# Patient Record
Sex: Male | Born: 1955 | Race: White | Hispanic: No | Marital: Married | Smoking: Never smoker
Health system: Southern US, Community
[De-identification: ages and names within clinical notes are randomized; demographics above are authoritative.]

## PROBLEM LIST (undated history)

## (undated) DIAGNOSIS — N400 Enlarged prostate without lower urinary tract symptoms: Secondary | ICD-10-CM

## (undated) DIAGNOSIS — M1990 Unspecified osteoarthritis, unspecified site: Secondary | ICD-10-CM

## (undated) DIAGNOSIS — J302 Other seasonal allergic rhinitis: Secondary | ICD-10-CM

## (undated) DIAGNOSIS — Z8619 Personal history of other infectious and parasitic diseases: Secondary | ICD-10-CM

## (undated) HISTORY — DX: Unspecified osteoarthritis, unspecified site: M19.90

## (undated) HISTORY — DX: Benign prostatic hyperplasia without lower urinary tract symptoms: N40.0

## (undated) HISTORY — DX: Other seasonal allergic rhinitis: J30.2

## (undated) HISTORY — DX: Personal history of other infectious and parasitic diseases: Z86.19

---

## 1978-12-16 HISTORY — PX: SHOULDER SURGERY: SHX246

## 2000-12-16 HISTORY — PX: MENISCUS REPAIR: SHX5179

## 2000-12-16 HISTORY — PX: KNEE SURGERY: SHX244

## 2003-12-17 DIAGNOSIS — Z8619 Personal history of other infectious and parasitic diseases: Secondary | ICD-10-CM

## 2003-12-17 HISTORY — DX: Personal history of other infectious and parasitic diseases: Z86.19

## 2004-08-06 ENCOUNTER — Encounter: Admission: RE | Admit: 2004-08-06 | Discharge: 2004-08-06 | Payer: Self-pay | Admitting: Internal Medicine

## 2004-09-03 ENCOUNTER — Encounter: Admission: RE | Admit: 2004-09-03 | Discharge: 2004-09-03 | Payer: Self-pay | Admitting: Gastroenterology

## 2004-09-05 ENCOUNTER — Encounter: Admission: RE | Admit: 2004-09-05 | Discharge: 2004-09-05 | Payer: Self-pay | Admitting: Gastroenterology

## 2009-12-16 HISTORY — PX: PROSTATECTOMY: SHX69

## 2010-11-29 ENCOUNTER — Encounter (INDEPENDENT_AMBULATORY_CARE_PROVIDER_SITE_OTHER): Payer: Self-pay | Admitting: Urology

## 2010-11-29 ENCOUNTER — Observation Stay (HOSPITAL_COMMUNITY)
Admission: RE | Admit: 2010-11-29 | Discharge: 2010-11-30 | Payer: Self-pay | Source: Home / Self Care | Attending: Urology | Admitting: Urology

## 2011-01-06 ENCOUNTER — Encounter: Payer: Self-pay | Admitting: Gastroenterology

## 2011-02-26 LAB — SURGICAL PCR SCREEN: Staphylococcus aureus: NEGATIVE

## 2012-07-30 ENCOUNTER — Ambulatory Visit
Admission: RE | Admit: 2012-07-30 | Discharge: 2012-07-30 | Disposition: A | Discharge status: Home / Self Care | Payer: Managed Care, Other (non HMO) | Source: Ambulatory Visit | Attending: Chiropractic Medicine | Admitting: Chiropractic Medicine

## 2012-07-30 ENCOUNTER — Other Ambulatory Visit: Payer: Self-pay | Admitting: Chiropractic Medicine

## 2012-07-30 DIAGNOSIS — R52 Pain, unspecified: Secondary | ICD-10-CM

## 2013-08-13 ENCOUNTER — Other Ambulatory Visit: Payer: Self-pay | Admitting: Interventional Cardiology

## 2013-08-13 ENCOUNTER — Encounter (HOSPITAL_COMMUNITY): Payer: Self-pay | Admitting: Pharmacy Technician

## 2013-08-19 ENCOUNTER — Encounter (HOSPITAL_COMMUNITY)
Admission: RE | Disposition: A | Discharge status: Home / Self Care | Payer: Self-pay | Source: Ambulatory Visit | Attending: Interventional Cardiology

## 2013-08-19 ENCOUNTER — Ambulatory Visit (HOSPITAL_COMMUNITY)
Admission: RE | Admit: 2013-08-19 | Discharge: 2013-08-19 | Disposition: A | Discharge status: Home / Self Care | Payer: BC Managed Care – PPO | Source: Ambulatory Visit | Attending: Interventional Cardiology | Admitting: Interventional Cardiology

## 2013-08-19 DIAGNOSIS — R9439 Abnormal result of other cardiovascular function study: Secondary | ICD-10-CM | POA: Diagnosis present

## 2013-08-19 DIAGNOSIS — I208 Other forms of angina pectoris: Secondary | ICD-10-CM | POA: Diagnosis present

## 2013-08-19 DIAGNOSIS — I2510 Atherosclerotic heart disease of native coronary artery without angina pectoris: Secondary | ICD-10-CM | POA: Insufficient documentation

## 2013-08-19 HISTORY — PX: LEFT HEART CATHETERIZATION WITH CORONARY ANGIOGRAM: SHX5451

## 2013-08-19 SURGERY — LEFT HEART CATHETERIZATION WITH CORONARY ANGIOGRAM
Anesthesia: LOCAL

## 2013-08-19 MED ORDER — ACETAMINOPHEN 325 MG PO TABS: 650.0000 mg | ORAL_TABLET | ORAL | Status: DC | PRN

## 2013-08-19 MED ORDER — NITROGLYCERIN 0.2 MG/ML ON CALL CATH LAB
INTRAVENOUS | Status: AC
  Filled 2013-08-19: qty 1

## 2013-08-19 MED ORDER — VERAPAMIL HCL 2.5 MG/ML IV SOLN
INTRAVENOUS | Status: AC
  Filled 2013-08-19: qty 2

## 2013-08-19 MED ORDER — ASPIRIN 81 MG PO CHEW: 81.0000 mg | CHEWABLE_TABLET | Freq: Every day | ORAL | Status: DC

## 2013-08-19 MED ORDER — SODIUM CHLORIDE 0.9 % IJ SOLN: 3.0000 mL | Freq: Two times a day (BID) | INTRAMUSCULAR | Status: DC

## 2013-08-19 MED ORDER — ONDANSETRON HCL 4 MG/2ML IJ SOLN: 4.0000 mg | Freq: Four times a day (QID) | INTRAMUSCULAR | Status: DC | PRN

## 2013-08-19 MED ORDER — DIAZEPAM 5 MG PO TABS
5.0000 mg | ORAL_TABLET | ORAL | Status: AC
  Administered 2013-08-19: 5 mg via ORAL
  Filled 2013-08-19: qty 1

## 2013-08-19 MED ORDER — ASPIRIN 81 MG PO CHEW
324.0000 mg | CHEWABLE_TABLET | ORAL | Status: AC
  Administered 2013-08-19: 324 mg via ORAL
  Filled 2013-08-19: qty 4

## 2013-08-19 MED ORDER — LIDOCAINE HCL (PF) 1 % IJ SOLN
INTRAMUSCULAR | Status: AC
  Filled 2013-08-19: qty 30

## 2013-08-19 MED ORDER — HEPARIN (PORCINE) IN NACL 2-0.9 UNIT/ML-% IJ SOLN
INTRAMUSCULAR | Status: AC
  Filled 2013-08-19: qty 1000

## 2013-08-19 MED ORDER — MIDAZOLAM HCL 2 MG/2ML IJ SOLN
INTRAMUSCULAR | Status: AC
  Filled 2013-08-19: qty 2

## 2013-08-19 MED ORDER — SODIUM CHLORIDE 0.9 % IV SOLN: INTRAVENOUS | Status: DC

## 2013-08-19 MED ORDER — FENTANYL CITRATE 0.05 MG/ML IJ SOLN
INTRAMUSCULAR | Status: AC
  Filled 2013-08-19: qty 2

## 2013-08-19 MED ORDER — SODIUM CHLORIDE 0.9 % IV SOLN: 250.0000 mL | INTRAVENOUS | Status: DC | PRN

## 2013-08-19 MED ORDER — SODIUM CHLORIDE 0.9 % IJ SOLN: 3.0000 mL | INTRAMUSCULAR | Status: DC | PRN

## 2013-08-19 MED ORDER — SODIUM CHLORIDE 0.9 % IV SOLN
INTRAVENOUS | Status: DC
  Administered 2013-08-19: 08:00:00 via INTRAVENOUS

## 2013-08-19 MED ORDER — HEPARIN SODIUM (PORCINE) 1000 UNIT/ML IJ SOLN
INTRAMUSCULAR | Status: AC
  Filled 2013-08-19: qty 1

## 2013-08-19 NOTE — H&P (Signed)
  The patient has been experiencing chest discomfort on exertion. There is been a consistent finding over the past several weeks as he is trying to get back into a program of aerobic exercise. He subsequently underwent a stress nuclear study which was abnormal, moderate risk. Given his young age and the limitations in part on his lifestyle, we are performing coronary angiography to define anatomy. He's had no discomfort at rest. He is taking aspirin one per day. He has not needed to use any nitroglycerin.  The procedure and risks including stroke, death, myocardial infarction, contrast allergy, limb ischemia, bleeding, among others were discussed in detail with the patient and his wife. He understands and has given consent to proceed with the procedure.

## 2013-08-19 NOTE — Progress Notes (Signed)
Upon removing 3 cc of air from TR band, site started bleeding. 3 cc air injected into TR band balloon, bleeding immediately stopped. Will monitor.

## 2013-08-19 NOTE — CV Procedure (Signed)
     Diagnostic Cardiac Catheterization Report  Tyler Castro  57 y.o.  male 1956/02/12  Procedure Date: 08/19/2013  Referring Physician: Elias Else M.D. Primary Cardiologist:: HWB Leia Alf, M.D.   PROCEDURE:  Left heart catheterization with selective coronary angiography, left ventriculogram.  INDICATIONS:  Exertional chest tightness, abnormal nuclear study, with concern for angina pectoris.  The risks, benefits, and details of the procedure were explained to the patient.  The patient verbalized understanding and wanted to proceed.  Informed written consent was obtained.  PROCEDURE TECHNIQUE:  After Xylocaine anesthesia a 5 [place] Skinny sheath was placed in the right radial artery with a single anterior needle wall stick.   Coronary angiography was done using a 5 French A2 MP and 3.5 cm Judkins left diagnostic catheter.  Left ventriculography was done using a 5 [place] A2 MP catheter.    CONTRAST:  Total of 110 cc.  COMPLICATIONS:  None.    HEMODYNAMICS:  Aortic pressure was 102/60; LV pressure was 106/5; LVEDP 14 mm mercury.  There was no gradient between the left ventricle and aorta.    ANGIOGRAPHIC DATA:  Cinefluoroscopy of the cardiac silhouette demonstrates left coronary calcification  The left main coronary artery is widely patent.  The left anterior descending artery is widely patent LAD and diagonals. The proximal LAD has linear calcification just after the origin from the left main. Both diagonals have minimal ostial narrowing..  The left circumflex artery is widely patent giving origin to 3 obtuse marginal branches and a large left atrial recurrent arising just inferior to the second obtuse marginal..  The right coronary artery is distal eccentric 25-35% stenosis. Large vessel without any significant obstruction.  LEFT VENTRICULOGRAM:  Left ventricular angiogram was done in the 30 RAO projection and revealed normal left ventricular wall motion and systolic  function with an estimated ejection fraction of 60 %.    IMPRESSIONS:  1. Coronary calcification, LAD, but widely patent vessel lumen. The right coronary has distal eccentric less than 30% narrowing.  2. Normal left ventricular function.  3. Overall, coronary angiography demonstrates widely patent coronaries, and should not be responsible for any symptoms. For the patient's age, these findings are essentially normal. The myocardial perfusion pattern on nuclear testing likely represents a normal variant.   RECOMMENDATION:  Aspirin and primary prevention with consideration of statin therapy if indicated based upon numbers.Marland Kitchen

## 2013-09-24 ENCOUNTER — Encounter: Payer: Self-pay | Admitting: Pulmonary Disease

## 2013-09-24 ENCOUNTER — Ambulatory Visit (INDEPENDENT_AMBULATORY_CARE_PROVIDER_SITE_OTHER): Payer: BC Managed Care – PPO | Admitting: Pulmonary Disease

## 2013-09-24 VITALS — BP 132/90 | HR 60 | Temp 98.7°F | Ht 76.0 in | Wt 223.8 lb

## 2013-09-24 DIAGNOSIS — R0609 Other forms of dyspnea: Secondary | ICD-10-CM | POA: Insufficient documentation

## 2013-09-24 NOTE — Progress Notes (Signed)
  Subjective:    Patient ID: Tyler Castro, male    DOB: Sep 20, 1956, 57 y.o.   MRN: [id number]  HPI The patient is a 57 year old male who had been asked to see for dyspnea on exertion.  He has a fairly active person, and was doing well until March of this year when he began to develop dyspnea on running.  This was new for him at that particular level of exertion.  He feels that his symptoms have been progressive since that time, and in June and July of this year began to develop chest tightness.  He underwent stress testing which was abnormal, which subsequently led to a cardiac catheterization that was also unremarkable.  He had a chest x-ray that was clear, but showed "emphysematous changes".  The patient denies any history of childhood asthma, but his mother did have asthma diagnosed in her 37s.  He does have seasonal allergies.  He has no significant cough except occasionally at night on lying down.  He denies feeling a sensation of air trapping.   Review of Systems  Constitutional: Negative for fever and unexpected weight change.  HENT: Positive for congestion. Negative for dental problem, ear pain, nosebleeds, postnasal drip, rhinorrhea, sinus pressure, sneezing, sore throat and trouble swallowing.   Eyes: Negative for redness and itching.  Respiratory: Positive for chest tightness, shortness of breath and wheezing. Negative for cough.   Cardiovascular: Positive for palpitations. Negative for chest pain and leg swelling.  Gastrointestinal: Negative for nausea and vomiting.  Genitourinary: Negative for dysuria.  Musculoskeletal: Negative for joint swelling.  Skin: Negative for rash.  Neurological: Positive for headaches ( sinus related).  Hematological: Does not bruise/bleed easily.  Psychiatric/Behavioral: Negative for dysphoric mood. The patient is not nervous/anxious.        Objective:   Physical Exam Constitutional:  Well developed, no acute distress  HENT:  Nares patent without  discharge  Oropharynx without exudate, palate and uvula are normal  Eyes:  Perrla, eomi, no scleral icterus  Neck:  No JVD, no TMG  Cardiovascular:  Normal rate, regular rhythm, no rubs or gallops.  No murmurs        Intact distal pulses  Pulmonary :  Normal breath sounds, no stridor or respiratory distress   No rales, rhonchi, or wheezing  Abdominal:  Soft, nondistended, bowel sounds present.  No tenderness noted.   Musculoskeletal:  No lower extremity edema noted.  Lymph Nodes:  No cervical lymphadenopathy noted  Skin:  No cyanosis noted  Neurologic:  Alert, appropriate, moves all 4 extremities without obvious deficit.         Assessment & Plan:

## 2013-09-24 NOTE — Assessment & Plan Note (Signed)
The patient has dyspnea on exertion starting in March of this year that has been progressive.  He has had a cardiac catheterization which was unremarkable, and a chest x-ray that was clear but raised the question of emphysematous changes.  The patient's dyspnea and exertion primarily related to severe exertional activities.  At the same time however, he recently was in [place] climbing and did not have difficulties even at altitude.  The patient has never smoked, but does have a family history of asthma later in life.  At this point, he will need full pulmonary function studies for evaluation, and if normal, I would recommend an echocardiogram and possibly a cardiopulmonary exercise test.

## 2013-09-24 NOTE — Patient Instructions (Signed)
Will schedule for pulmonary function tests.  Will call you once results are available.

## 2013-09-28 ENCOUNTER — Ambulatory Visit (INDEPENDENT_AMBULATORY_CARE_PROVIDER_SITE_OTHER): Payer: BC Managed Care – PPO | Admitting: Pulmonary Disease

## 2013-09-28 DIAGNOSIS — R0609 Other forms of dyspnea: Secondary | ICD-10-CM

## 2013-09-28 LAB — PULMONARY FUNCTION TEST

## 2013-09-28 NOTE — Progress Notes (Signed)
PFT done today. 

## 2013-10-01 ENCOUNTER — Telehealth: Payer: Self-pay | Admitting: Pulmonary Disease

## 2013-10-01 NOTE — Telephone Encounter (Signed)
Let pt know that his pfts are normal.  No airflow obstruction to suggest copd.  I think he should have a cardiopulmonary exercise test as we discussed to see if occult lung or cardiac issues if he wishes to pursue this aggressively.

## 2013-10-01 NOTE — Telephone Encounter (Signed)
Pt had PFT on 09/28/13. I made pt aware we will send the message over to [facility] advising him of this. Please advise KC thanks

## 2013-10-04 ENCOUNTER — Telehealth: Payer: Self-pay | Admitting: Pulmonary Disease

## 2013-10-04 NOTE — Telephone Encounter (Signed)
lmomtcb for pt on his named VM.

## 2013-10-04 NOTE — Telephone Encounter (Signed)
I spoke with pt. He is wanting to know if the test suggests asthma? What was the results of the before/after of the albuterol? He wants to know exactly what his breathing tests mean? He wants to know if his PFT was "high normal" or in the "low normal" range? Wants to know what exactly Dr. Shelle Iron is looking to learn from the Cardio pulmonary exercise test? He wants specifics on all. Please advise KC thanks

## 2013-10-04 NOTE — Telephone Encounter (Signed)
I made pt aware. He stated he will call us back and let us know. Will sign off message

## 2013-10-04 NOTE — Telephone Encounter (Signed)
Pt returned call and can be reached @ [phone]. Tyler Castro

## 2013-10-05 ENCOUNTER — Other Ambulatory Visit: Payer: Self-pay | Admitting: Pulmonary Disease

## 2013-10-05 NOTE — Telephone Encounter (Signed)
Discussed test results at great length with pt. Have recommended CPST, and pt agrees.

## 2013-10-06 NOTE — Telephone Encounter (Signed)
Order was placed for CPST by Dr Shelle Iron.  Will close message

## 2013-10-21 ENCOUNTER — Ambulatory Visit (HOSPITAL_COMMUNITY): Payer: BC Managed Care – PPO | Attending: Pulmonary Disease

## 2013-10-21 ENCOUNTER — Encounter: Payer: Self-pay | Admitting: Pulmonary Disease

## 2013-10-21 DIAGNOSIS — R0609 Other forms of dyspnea: Secondary | ICD-10-CM

## 2013-10-21 DIAGNOSIS — R0989 Other specified symptoms and signs involving the circulatory and respiratory systems: Secondary | ICD-10-CM | POA: Insufficient documentation

## 2013-10-28 ENCOUNTER — Telehealth: Payer: Self-pay | Admitting: Pulmonary Disease

## 2013-10-28 NOTE — Telephone Encounter (Signed)
I spoke with pt. He had CPST on 10/21/13. Pt is requesting these results. Please advise KC thanks

## 2013-10-29 NOTE — Telephone Encounter (Signed)
Please let him know that his study has not been read by my partner who reads these studies for [place].  I will send MR a message for him to do so, then will call pt back.

## 2013-10-29 NOTE — Telephone Encounter (Signed)
Called, spoke with pt.  Explained below to him.  He verbalized understanding and was ok with this.   Will route msg to MR as a reminder and to advise once study has been read. Thank you.

## 2013-11-01 NOTE — Telephone Encounter (Signed)
Let KC know that I have read thie results and in his inbox; forwarding to [facility]  Dr. Kalman Shan, M.D., [facility].C.P Pulmonary and Critical Care Medicine Staff Physician [facility] Pager: [phone], If no answer or between  15:00h - 7:00h: call [phone]  11/01/2013 1:34 PM

## 2013-11-02 NOTE — Telephone Encounter (Signed)
Long discussion with pt about CPST, does normal really mean normal in fit individual??  There is no evidence for EIA, and VO2 /anaerobic threshold were normal.   He did have some pvc's at times during the study, but nothing consistent.I have offered to do CT chest to evaluate lung parenchyma and r/o occult PE, but have told him it has low yield.  There is really no other pulmonary evaluation to do at this point.  He remained concerned about PVC's, and told him I would send Dr. Katrinka Blazing a note.

## 2013-11-03 ENCOUNTER — Telehealth: Payer: Self-pay | Admitting: Interventional Cardiology

## 2013-11-03 DIAGNOSIS — R0609 Other forms of dyspnea: Secondary | ICD-10-CM

## 2013-11-03 NOTE — Telephone Encounter (Signed)
returned pt call. pt sts that he has had a previous cardiac work up with Dr.Smith ti determine what was causing his sob.his cardiac testing did not show the cause and he was ref to pulmonology. pt has had a cardio pulmonary exercise test and had rqst the results be fwd to Dr.Smith.pt would like an appt with Dr.Smith once those results are rvcd.adv pt that would be fine and I will call him to sch once test results rcvd.pt verbalized understanding

## 2013-11-03 NOTE — Telephone Encounter (Signed)
New message     Pt want to talk to Dr Michaelle Copas nurse.  He would not give me details---he said it was long and complicated.

## 2013-11-09 ENCOUNTER — Telehealth: Payer: Self-pay | Admitting: Interventional Cardiology

## 2013-11-09 NOTE — Telephone Encounter (Signed)
Dr.Smith would like pt to have an echo.lmom scheduling  will call pt to sch and pt is to  have a f/u appt with Dr.Smith after echo

## 2013-11-09 NOTE — Telephone Encounter (Signed)
New message ° ° ° °Returned Lisa's call °

## 2013-11-25 ENCOUNTER — Encounter: Payer: Self-pay | Admitting: Cardiovascular Disease

## 2013-11-25 ENCOUNTER — Ambulatory Visit (HOSPITAL_COMMUNITY): Payer: BC Managed Care – PPO | Attending: Cardiovascular Disease | Admitting: Radiology

## 2013-11-25 DIAGNOSIS — R0609 Other forms of dyspnea: Secondary | ICD-10-CM

## 2013-11-25 DIAGNOSIS — R0602 Shortness of breath: Secondary | ICD-10-CM | POA: Insufficient documentation

## 2013-11-25 NOTE — Progress Notes (Signed)
Echocardiogram performed.  

## 2013-11-30 ENCOUNTER — Telehealth: Payer: Self-pay

## 2013-11-30 ENCOUNTER — Ambulatory Visit (INDEPENDENT_AMBULATORY_CARE_PROVIDER_SITE_OTHER): Payer: BC Managed Care – PPO | Admitting: Interventional Cardiology

## 2013-11-30 ENCOUNTER — Encounter: Payer: Self-pay | Admitting: Interventional Cardiology

## 2013-11-30 VITALS — BP 130/74 | HR 48 | Ht 76.0 in | Wt 219.0 lb

## 2013-11-30 DIAGNOSIS — R002 Palpitations: Secondary | ICD-10-CM

## 2013-11-30 DIAGNOSIS — I493 Ventricular premature depolarization: Secondary | ICD-10-CM

## 2013-11-30 DIAGNOSIS — E785 Hyperlipidemia, unspecified: Secondary | ICD-10-CM

## 2013-11-30 DIAGNOSIS — R0609 Other forms of dyspnea: Secondary | ICD-10-CM

## 2013-11-30 DIAGNOSIS — R079 Chest pain, unspecified: Secondary | ICD-10-CM

## 2013-11-30 DIAGNOSIS — I4949 Other premature depolarization: Secondary | ICD-10-CM

## 2013-11-30 NOTE — Patient Instructions (Signed)
Your physician recommends that you continue on your current medications as directed. Please refer to the Current Medication list given to you today.  Your physician recommends that you return for a FASTING NMR lipid profile  Your physician has recommended that you wear an event monitor. Event monitors are medical devices that record the heart's electrical activity. Doctors most often [place] these monitors to diagnose arrhythmias. Arrhythmias are problems with the speed or rhythm of the heartbeat. The monitor is a small, portable device. You can wear one while you do your normal daily activities. This is usually used to diagnose what is causing palpitations/syncope (passing out). ( 2 WEEK CONTINUOUS MONITOR)  Follow up pending lab/monitor results

## 2013-11-30 NOTE — Progress Notes (Signed)
Patient ID: Tyler Castro, male   DOB: 1956/04/30, 57 y.o.   MRN: [id number]    [address] [place], [place]  [address] Phone: [phone] Fax:  [phone]  Date:  11/30/2013   ID:  Tyler Castro, DOB 13-Feb-1956, MRN [id number]  PCP:  Tyler Carmel, MD   ASSESSMENT:  1.  Exertional dyspnea and chest tightness 2.  Palpitations with previous documentation of PVCs during stress testing 3. Stress/anxiety 4. Coronary calcification PLAN:  1. Lipid NMR 2. Two-week continuous ambulatory monitor to be worn even during exercise 3. Further discussion after data obtained   SUBJECTIVE: Tyler Castro is a 57 y.o. male who continues to be bothered by limitations in exertional tolerance, predominantly dyspnea and mild chest tightness, with running. He is also now having more frequent PVCs/palpitations occurring at rest and with activity. We did no PVCs during both the exercise treadmill test that we performed and also doing his cardiopulmonary stress test. I findings so far is at the cardiopulmonary stress test was minimally abnormal for a highly trained person but performance was better than that for an age matched Tyler Castro for it. Coronary angiography was performed after an abnormal stress Cardiolite study demonstrated an inferior wall defect with peripheral redistribution. The coronary angiogram demonstrated left anterior descending calcification but no obstructive coronary atherosclerosis. Echocardiography demonstrates no surprising findings of hypertrophy or pulmonary hypertension. No evidence of pericardial disease.  The patient mentions significant stress both at work and home and wonders if this could be at play.   Wt Readings from Last 3 Encounters:  11/30/13 219 lb (99.338 kg)  09/24/13 223 lb 12.8 oz (101.515 kg)  08/19/13 210 lb (95.255 kg)     Past Medical History  Diagnosis Date  . History of hepatitis 2005  . BPH (benign prostatic hyperplasia)   . Arthritis     shoulder  . Seasonal allergies     Current Outpatient Prescriptions  Medication Sig Dispense Refill  . aspirin EC 81 MG tablet Take 81 mg by mouth daily.      . cetirizine (ZYRTEC) 10 MG tablet Take 10 mg by mouth daily as needed for allergies.      Marland Kitchen ibuprofen (ADVIL,MOTRIN) 200 MG tablet Take 400-800 mg by mouth every 6 (six) hours as needed for pain.       No current facility-administered medications for this visit.    Allergies:    Allergies  Allergen Reactions  . Sulfa Antibiotics     Liver function abnormal    Social History:  The patient  reports that he has never smoked. He does not have any smokeless tobacco history on file. He reports that he drinks alcohol. He reports that he does not use illicit drugs.   ROS:  Please see the history of present illness.   No prolonged palpitations or syncope.   All other systems reviewed and negative.   OBJECTIVE: VS:  BP 130/74  Pulse 48  Ht 6\' 4"  (1.93 m)  Wt 219 lb (99.338 kg)  BMI 26.67 kg/m2 Well nourished, well developed, in no acute distress, healthy but sad appearing HEENT: normal Neck: JVD flat. Carotid bruit absent  Cardiac:  normal S1, S2; RRR; no murmur Lungs:  clear to auscultation bilaterally, no wheezing, rhonchi or rales Abd: soft, nontender, no hepatomegaly Ext: Edema absent. Pulses 2+ Skin: warm and dry Neuro:  CNs 2-12 intact, no focal abnormalities noted  EKG:  Marked sinus bradycardia at 48 beats per minute with inferior T wave  inversion unchanged from prior tracings       Signed, Darci Needle III, MD 11/30/2013 1:34 PM

## 2013-11-30 NOTE — Telephone Encounter (Signed)
Message copied by Jarvis Newcomer on Tue Nov 30, 2013  3:54 PM ------      Message from: Verdis Prime      Created: Sun Nov 28, 2013  2:40 PM       No significant findings. ------

## 2013-11-30 NOTE — Telephone Encounter (Signed)
pt given echo results.pt verbalized understanding.

## 2013-12-07 ENCOUNTER — Other Ambulatory Visit: Payer: Self-pay | Admitting: Neurology

## 2013-12-07 DIAGNOSIS — G259 Extrapyramidal and movement disorder, unspecified: Secondary | ICD-10-CM

## 2013-12-14 ENCOUNTER — Other Ambulatory Visit: Payer: Self-pay

## 2013-12-14 ENCOUNTER — Ambulatory Visit: Payer: BC Managed Care – PPO | Admitting: *Deleted

## 2013-12-14 ENCOUNTER — Encounter (INDEPENDENT_AMBULATORY_CARE_PROVIDER_SITE_OTHER): Payer: BC Managed Care – PPO

## 2013-12-14 ENCOUNTER — Encounter: Payer: Self-pay | Admitting: *Deleted

## 2013-12-14 DIAGNOSIS — I4949 Other premature depolarization: Secondary | ICD-10-CM

## 2013-12-14 DIAGNOSIS — R002 Palpitations: Secondary | ICD-10-CM

## 2013-12-14 DIAGNOSIS — I493 Ventricular premature depolarization: Secondary | ICD-10-CM

## 2013-12-14 DIAGNOSIS — E785 Hyperlipidemia, unspecified: Secondary | ICD-10-CM

## 2013-12-14 NOTE — Progress Notes (Signed)
Patient ID: Tyler Castro, male   DOB: 10/26/1956, 57 y.o.   MRN: [id number] [employer] 14 day cardiac event monitor applied to patient.

## 2013-12-15 LAB — NMR LIPOPROFILE WITH LIPIDS
Cholesterol, Total: 194 mg/dL (ref <200)
HDL-C: 57 mg/dL (ref >=40)
Large HDL-P: 9.4 umol/L (ref >=4.8)
Large VLDL-P: 1.1 nmol/L (ref <=2.7)
Triglycerides: 63 mg/dL (ref <150)
VLDL Size: 45.5 nm (ref <=46.6)

## 2013-12-18 ENCOUNTER — Ambulatory Visit
Admission: RE | Admit: 2013-12-18 | Discharge: 2013-12-18 | Disposition: A | Discharge status: Home / Self Care | Payer: BC Managed Care – PPO | Source: Ambulatory Visit | Attending: Neurology | Admitting: Neurology

## 2013-12-18 DIAGNOSIS — G259 Extrapyramidal and movement disorder, unspecified: Secondary | ICD-10-CM

## 2013-12-23 ENCOUNTER — Telehealth: Payer: Self-pay | Admitting: Interventional Cardiology

## 2013-12-23 NOTE — Telephone Encounter (Signed)
Returned call to patient he stated he wants to remove monitor early.Stated he is suppose to remove it on  12/28/13.Stated he is not able to sleep wearing monitor.Message sent to Dr.Smith's nurse.

## 2013-12-23 NOTE — Telephone Encounter (Signed)
New message   On heart monitor has some questions .

## 2013-12-28 ENCOUNTER — Telehealth: Payer: Self-pay

## 2013-12-28 NOTE — Telephone Encounter (Signed)
pt given NMR lipid results.pt given Dr.Smith recommendations to start Crestor 10mg  and recheck NMR and Alt in 6-8 wks. pt sts that he would need to think about starting Crestor and will call back when he is ready to do so.

## 2013-12-28 NOTE — Telephone Encounter (Signed)
Message copied by Jarvis NewcomerPARRIS-GODLEY, Refugio Vandevoorde S on Tue Dec 28, 2013  8:30 AM ------      Message from: Verdis PrimeSMITH, HENRY      Created: Sun Dec 26, 2013  3:23 PM       Particle number is high. Start Crestor 10 mg daily. NMR lipid and ALT in 6-8 weeks. ------

## 2013-12-29 NOTE — Telephone Encounter (Signed)
returned pt call.pt adv that we will call him once we receive monitor results back.pt sts that he does not want to  Start crestor, pt sts that he went into liver failure the last time he attempted to take a statin medication.pt sts that he  would like to discuss lifestyle and diet changes he could make to get lipids controlled.pt wants to sch an appt with Dr.Smith to discuss.

## 2014-01-05 ENCOUNTER — Telehealth: Payer: Self-pay

## 2014-01-05 NOTE — Telephone Encounter (Signed)
pt given results of cardiac monitor.NSR.occasional pvc, no vt.pailpitations and fluttering corrilate with PVC's.pt rqst f/u appt to discuss starting statin.appt made for 01/24/14 @3 :30.pt verbalized understanding.

## 2014-01-24 ENCOUNTER — Ambulatory Visit (INDEPENDENT_AMBULATORY_CARE_PROVIDER_SITE_OTHER): Payer: BC Managed Care – PPO | Admitting: Interventional Cardiology

## 2014-01-24 ENCOUNTER — Encounter: Payer: Self-pay | Admitting: Interventional Cardiology

## 2014-01-24 VITALS — BP 112/76 | HR 50 | Ht 76.0 in | Wt 215.0 lb

## 2014-01-24 DIAGNOSIS — E785 Hyperlipidemia, unspecified: Secondary | ICD-10-CM

## 2014-01-24 DIAGNOSIS — I209 Angina pectoris, unspecified: Secondary | ICD-10-CM

## 2014-01-24 DIAGNOSIS — I208 Other forms of angina pectoris: Secondary | ICD-10-CM

## 2014-01-24 DIAGNOSIS — I2510 Atherosclerotic heart disease of native coronary artery without angina pectoris: Secondary | ICD-10-CM | POA: Insufficient documentation

## 2014-01-24 DIAGNOSIS — I493 Ventricular premature depolarization: Secondary | ICD-10-CM | POA: Insufficient documentation

## 2014-01-24 DIAGNOSIS — I4949 Other premature depolarization: Secondary | ICD-10-CM

## 2014-01-24 NOTE — Patient Instructions (Signed)
Your physician recommends that you continue on your current medications as directed. Please refer to the Current Medication list given to you today.  Lab today; Tsh, T4  Your physician recommends that you return for a FASTING NMR lipid profile: June 2015

## 2014-01-24 NOTE — Progress Notes (Signed)
Patient ID: Tyler Castro, male   DOB: [birth year], 58 y.o.   MRN: [id number]    [address] [place], [place]  [id number] Phone: [phone](336) [phone] Fax:  [phone](336) [phone]  Date:  01/24/2014   ID:  Tyler OrganMartin Castro, DOB [birth year], MRN [id number]  PCP:  No PCP Per Patient   ASSESSMENT:  1. Hyperlipidemia with increased small particle number documented by lipoma L. Analysis 2. Chronic nonobstructive coronary artery disease with moderate to heavy coronary calcification noted by cinefluoroscopy, involving the LAD. 3. Exertional angina felt to be secondary to decreased coronary vasodilator reserve 4. 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 Statin therapy is recommended because of elevated lipids and presumed abnormal coronary vasomotor activity. The patient refuses statin therapy because of concern about complications/side effects 2. Continue baby aspirin per day 3. Strict heart healthy diet tending more towards vegetarian to include attempts at additional weight loss 4. Repeat lipoma at analysis in 4 months   SUBJECTIVE: Tyler Castro is a 58 y.o. male who is here to discuss his entire cardiac workup. This significant findings to this point include left coronary calcification identifiable by cinefluoroscopy. Nonobstructive coronary disease by angiography. A cardiopulmonary function test was "normal". Echocardiography was unremarkable. No evidence of pulmonary hypertension was identified. He continues to be limited by exertional chest tightness with activity such as jogging and walking briskly up an incline. He does not have discomfort at rest. Continuous monitoring has demonstrated a correlation between palpitations and isolated PVCs.   Wt Readings from Last 3 Encounters:  01/24/14 215 lb (97.523 kg)  11/30/13 219 lb (99.338 kg)  09/24/13 223 lb 12.8 oz (101.515 kg)     Past Medical History  Diagnosis Date  . History of hepatitis 2005  . BPH (benign  prostatic hyperplasia)   . Arthritis     shoulder  . Seasonal allergies     Current Outpatient Prescriptions  Medication Sig Dispense Refill  . aspirin EC 81 MG tablet Take 81 mg by mouth daily.      . cetirizine (ZYRTEC) 10 MG tablet Take 10 mg by mouth daily as needed for allergies.      Marland Kitchen. ibuprofen (ADVIL,MOTRIN) 200 MG tablet Take 400-800 mg by mouth every 6 (six) hours as needed for pain.      Marland Kitchen. MAGNESIUM PO Take by mouth. TAKE ONE PILL DAILY       No current facility-administered medications for this visit.    Allergies:    Allergies  Allergen Reactions  . Sulfa Antibiotics     Liver function abnormal    Social History:  The patient  reports that he has never smoked. He does not have any smokeless tobacco history on file. He reports that he drinks alcohol. He reports that he does not use illicit drugs.   ROS:  Please see the history of present illness.   Concern about frequent adverse reactions to nearly any medication that he uses. Had sulfa related hepatitis which serves as a Genesis for his concern about medical therapy of any sort   All other systems reviewed and negative.   OBJECTIVE: VS:  BP 112/76  Pulse 50  Ht 6\' 4"  (1.93 m)  Wt 215 lb (97.523 kg)  BMI 26.18 kg/m2  SpO2 99% Well nourished, well developed, in no acute distress, appears healthy HEENT: normal Neck: JVD flat. Carotid bruit absent  Cardiac:  normal S1, S2; RRR; no murmur Lungs:  clear to auscultation bilaterally, no wheezing, rhonchi  or rales Abd: soft, nontender, no hepatomegaly Ext: Edema absent. Pulses 2+ and symmetric Skin: warm and dry Neuro:  CNs 2-12 intact, no focal abnormalities noted  EKG:  Not repeated       Signed, Darci Needle III, MD 01/24/2014 3:53 PM

## 2014-01-25 LAB — T4, FREE: Free T4: 0.81 ng/dL (ref 0.60–1.60)

## 2014-01-25 LAB — TSH: TSH: 1.64 u[IU]/mL (ref 0.35–5.50)

## 2014-01-26 NOTE — Progress Notes (Signed)
Quick Note:  Preliminary report reviewed by triage nurse and sent to MD desk. ______ 

## 2014-02-02 ENCOUNTER — Telehealth: Payer: Self-pay

## 2014-02-02 NOTE — Telephone Encounter (Signed)
Message copied by Jarvis NewcomerPARRIS-GODLEY, LISA S on Wed Feb 02, 2014  9:57 AM ------      Message from: Verdis PrimeSMITH, HENRY      Created: Sat Jan 29, 2014 12:25 PM       Thyroid studies are normal. ------

## 2014-02-02 NOTE — Telephone Encounter (Signed)
lmom.Thyroid studies are normal.

## 2014-05-17 ENCOUNTER — Other Ambulatory Visit: Payer: BC Managed Care – PPO

## 2014-05-17 DIAGNOSIS — E785 Hyperlipidemia, unspecified: Secondary | ICD-10-CM

## 2014-05-18 ENCOUNTER — Telehealth: Payer: Self-pay | Admitting: Interventional Cardiology

## 2014-05-18 LAB — NMR LIPOPROFILE WITH LIPIDS
CHOLESTEROL, TOTAL: 181 mg/dL (ref <200)
HDL PARTICLE NUMBER: 31.7 umol/L (ref >=30.5)
HDL Size: 9.8 nm (ref >=9.2)
HDL-C: 64 mg/dL (ref >=40)
LARGE HDL: 10 umol/L (ref >=4.8)
LDL CALC: 105 mg/dL — AB (ref <100)
LDL Particle Number: 1237 nmol/L — ABNORMAL HIGH (ref <1000)
LDL Size: 21.3 nm (ref >20.5)
Large VLDL-P: <0.8 nmol/L (ref <=2.7)
SMALL LDL PARTICLE NUMBER: 346 nmol/L (ref <=527)
Triglycerides: 62 mg/dL (ref <150)
VLDL Size: 37.9 nm (ref <=46.6)

## 2014-05-18 NOTE — Telephone Encounter (Signed)
New message ° ° ° ° °Want lab results °

## 2014-05-18 NOTE — Telephone Encounter (Signed)
Pt contacting to see if Dr Katrinka Blazing has reviewed his recent lab work from 6/2 and given his final recommendation.  Pt also wanting to know if he will need to schedule an f/u OV with Dr Katrinka Blazing based on his lab results.  Informed pt that Dr Katrinka Blazing has not reviewed lab work and given his final recommendation yet, but as soon as he does, his CMA will contact him to further advise on new orders.  Pt verbalized understanding and agrees with this plan.

## 2014-05-25 ENCOUNTER — Telehealth: Payer: Self-pay

## 2014-05-25 NOTE — Telephone Encounter (Signed)
Pt aware of lab results.and Dr.Smiths recommendations.There is dramatic improvement . Near target now. Needs to maintain current approach. Could be at goal on minimal statin dose and current lifestyle changes. I suggest low dose Atorvastatin 10 mg daily and repeat statin panel in 6 weeks.pt would like to hold off on starting statin, continue with lifestyle change and repeat NMR lipid in a couple months to see if he is closer to goal.adv pt I will fwd to Dr.Smith to see when he thinks it would be appropriate to repeat lab.pt agreeable and verbalized understanding.

## 2014-05-25 NOTE — Telephone Encounter (Signed)
Message copied by Jarvis Newcomer on Wed May 25, 2014  1:01 PM ------      Message from: Verdis Prime      Created: Wed May 18, 2014  4:47 PM       There is dramatic improvement . Near target now. Needs to maintain current approach. Could be at goal on minimal statin dose and current lifestyle changes. I suggest low dose Atorvastatin 10 mg daily and repeat statin panel in 6 weeks ------

## 2014-05-25 NOTE — Telephone Encounter (Signed)
Message copied by Jarvis Newcomer on Wed May 25, 2014  1:09 PM ------      Message from: Verdis Prime      Created: Wed May 18, 2014  4:47 PM       There is dramatic improvement . Near target now. Needs to maintain current approach. Could be at goal on minimal statin dose and current lifestyle changes. I suggest low dose Atorvastatin 10 mg daily and repeat statin panel in 6 weeks ------

## 2014-07-05 ENCOUNTER — Telehealth: Payer: Self-pay | Admitting: Interventional Cardiology

## 2014-07-05 DIAGNOSIS — E7800 Pure hypercholesterolemia, unspecified: Secondary | ICD-10-CM

## 2014-07-05 DIAGNOSIS — Z79899 Other long term (current) drug therapy: Secondary | ICD-10-CM

## 2014-07-05 NOTE — Telephone Encounter (Signed)
New message     When should patient have a follow up blood draw?

## 2014-07-06 ENCOUNTER — Other Ambulatory Visit (INDEPENDENT_AMBULATORY_CARE_PROVIDER_SITE_OTHER): Payer: BC Managed Care – PPO

## 2014-07-06 DIAGNOSIS — E7800 Pure hypercholesterolemia, unspecified: Secondary | ICD-10-CM

## 2014-07-06 DIAGNOSIS — Z79899 Other long term (current) drug therapy: Secondary | ICD-10-CM

## 2014-07-06 LAB — HEPATIC FUNCTION PANEL
ALT: 21 U/L (ref 0–53)
AST: 23 U/L (ref 0–37)
Albumin: 4 g/dL (ref 3.5–5.2)
Alkaline Phosphatase: 49 U/L (ref 39–117)
BILIRUBIN DIRECT: 0 mg/dL (ref 0.0–0.3)
BILIRUBIN TOTAL: 0.6 mg/dL (ref 0.2–1.2)
Total Protein: 6.7 g/dL (ref 6.0–8.3)

## 2014-07-07 LAB — NMR LIPOPROFILE WITH LIPIDS
Cholesterol, Total: 181 mg/dL (ref <200)
HDL Particle Number: 31.6 umol/L (ref >=30.5)
HDL SIZE: 9.8 nm (ref >=9.2)
HDL-C: 60 mg/dL (ref >=40)
LARGE HDL: 10 umol/L (ref >=4.8)
LDL (calc): 109 mg/dL — ABNORMAL HIGH (ref <100)
LDL PARTICLE NUMBER: 1076 nmol/L — AB (ref <1000)
LDL Size: 21.4 nm (ref >20.5)
LP-IR Score: <25 (ref <=45)
Large VLDL-P: 1.1 nmol/L (ref <=2.7)
Small LDL Particle Number: 243 nmol/L (ref <=527)
TRIGLYCERIDES: 61 mg/dL (ref <150)
VLDL SIZE: 40.9 nm (ref <=46.6)

## 2014-07-08 ENCOUNTER — Telehealth: Payer: Self-pay

## 2014-07-08 DIAGNOSIS — E785 Hyperlipidemia, unspecified: Secondary | ICD-10-CM

## 2014-07-08 NOTE — Telephone Encounter (Signed)
Message copied by Lamar Laundry on Fri Jul 08, 2014  2:20 PM ------      Message from: Daneen Schick      Created: Fri Jul 08, 2014  1:10 PM       The patient's lipid panel has gotten to target. He needs to keep doing what he is doing. I suggest we repeat a lipid MMR in 6-8 months to see if he can maintain this. If he cannot maintain this I was still consider low-dose therapy that no he is against this. ------

## 2014-07-08 NOTE — Telephone Encounter (Signed)
called to give pt lab results.lmtcb 

## 2014-07-08 NOTE — Telephone Encounter (Signed)
pt called the office and was given lab results. patient's lipid panel has gotten to target.pt needs to keep doing what he is doing. Dr.Smith suggest we repeat a lipid MMR in 6-8 months to see if pt can maintain this. If he cannot maintain this Dr.Smith would still  consider low-dose therapy he knows pt is against this.pt verbalized understanding.

## 2014-07-08 NOTE — Telephone Encounter (Signed)
Message copied by Lamar Laundry on Fri Jul 08, 2014  4:12 PM ------      Message from: Daneen Schick      Created: Fri Jul 08, 2014  1:10 PM       The patient's lipid panel has gotten to target. He needs to keep doing what he is doing. I suggest we repeat a lipid MMR in 6-8 months to see if he can maintain this. If he cannot maintain this I was still consider low-dose therapy that no he is against this. ------

## 2014-08-15 ENCOUNTER — Telehealth: Payer: Self-pay | Admitting: Interventional Cardiology

## 2014-08-15 NOTE — Telephone Encounter (Signed)
New message    Patient calling C/O having trouble sleeping. Suggestion on what med's to take.

## 2014-08-15 NOTE — Telephone Encounter (Signed)
Pt called to ask what he can take for sleep. Pt is having problems sleeping during the night. Pt would like to know if he can take tylenol PM. Pt is aware that it would be safe for him to take Tylenol Pm. And if it does not help he needs to call his PCP for any other sleeping medication. Pt states he is in the process of getting one now.

## 2014-11-24 ENCOUNTER — Encounter (HOSPITAL_COMMUNITY): Payer: Self-pay | Admitting: Interventional Cardiology

## 2014-12-07 ENCOUNTER — Other Ambulatory Visit (INDEPENDENT_AMBULATORY_CARE_PROVIDER_SITE_OTHER): Payer: BC Managed Care – PPO | Admitting: *Deleted

## 2014-12-07 DIAGNOSIS — E785 Hyperlipidemia, unspecified: Secondary | ICD-10-CM

## 2014-12-10 LAB — NMR LIPOPROFILE WITH LIPIDS
CHOLESTEROL, TOTAL: 164 mg/dL (ref 100–199)
HDL Particle Number: 26.8 umol/L — ABNORMAL LOW (ref >=30.5)
HDL Size: 9.9 nm (ref >=9.2)
HDL-C: 58 mg/dL (ref >39)
LDL CALC: 97 mg/dL (ref 0–99)
LDL PARTICLE NUMBER: 970 nmol/L (ref <1000)
LDL SIZE: 21.3 nm (ref >=20.8)
Large HDL-P: 9.3 umol/L (ref >=4.8)
Large VLDL-P: 1.3 nmol/L (ref <=2.7)
SMALL LDL PARTICLE NUMBER: 165 nmol/L (ref <=527)
Triglycerides: 46 mg/dL (ref 0–149)
VLDL SIZE: 42.6 nm (ref <=46.6)

## 2014-12-20 ENCOUNTER — Telehealth: Payer: Self-pay | Admitting: *Deleted

## 2014-12-20 NOTE — Telephone Encounter (Signed)
-----   Message from Lyn RecordsHenry W Smith III, MD sent at 12/16/2014  5:48 PM EST ----- Labs are improved with reduction in bad cholesterol by 10% and labs in normal range

## 2014-12-20 NOTE — Telephone Encounter (Signed)
He needs follow-up in mid to late spring with an EKG.

## 2014-12-20 NOTE — Telephone Encounter (Signed)
Pt aware of lab results. Labs are improved with reduction in bad cholesterol by 10% and labs in normal range.  Forwarding Dr. Katrinka BlazingSmith a message to advise on f/u with the pt.

## 2014-12-26 NOTE — Telephone Encounter (Signed)
Pt aware. Dr.Smith recommends He needs follow-up in mid to late spring with an EKG. Recall in epic.pt verbalized understanding

## 2015-03-27 ENCOUNTER — Ambulatory Visit (INDEPENDENT_AMBULATORY_CARE_PROVIDER_SITE_OTHER): Payer: BLUE CROSS/BLUE SHIELD | Admitting: Interventional Cardiology

## 2015-03-27 ENCOUNTER — Encounter: Payer: Self-pay | Admitting: Interventional Cardiology

## 2015-03-27 VITALS — BP 100/60 | HR 46 | Ht 76.0 in | Wt 210.8 lb

## 2015-03-27 DIAGNOSIS — G20 Parkinson's disease: Secondary | ICD-10-CM | POA: Diagnosis not present

## 2015-03-27 DIAGNOSIS — E785 Hyperlipidemia, unspecified: Secondary | ICD-10-CM | POA: Diagnosis not present

## 2015-03-27 DIAGNOSIS — I208 Other forms of angina pectoris: Secondary | ICD-10-CM | POA: Diagnosis not present

## 2015-03-27 DIAGNOSIS — I2510 Atherosclerotic heart disease of native coronary artery without angina pectoris: Secondary | ICD-10-CM | POA: Diagnosis not present

## 2015-03-27 NOTE — Progress Notes (Signed)
Cardiology Office Note   Date:  03/27/2015   ID:  Tyler Castro, DOB 1956-02-21, MRN [id number]  PCP:  No PCP Per Patient  Cardiologist:   Lesleigh Noe, MD   No chief complaint on file.     History of Present Illness: Tyler Castro is a 59 y.o. male who presents for nonobstructive coronary artery disease identified by cine fluoroscopy and catheterization as calcified coronary arteries with less than 30% obstruction. Only symptom has been exertional dyspnea.   He now has a diagnosis of Parkinson's disease. He is back to running and feels he is better now than previously. He denies orthopnea PND. No chest discomfort or other complaints.    Past Medical History  Diagnosis Date  . History of hepatitis 2005  . BPH (benign prostatic hyperplasia)   . Arthritis     shoulder  . Seasonal allergies     Past Surgical History  Procedure Laterality Date  . Shoulder surgery Right 1980  . Knee surgery Right 2002  . Meniscus repair Left 2002  . Prostatectomy  2011  . Left heart catheterization with coronary angiogram N/A 08/19/2013    Procedure: LEFT HEART CATHETERIZATION WITH CORONARY ANGIOGRAM;  Surgeon: Lesleigh Noe, MD;  Location: [facility] CATH LAB;  Service: Cardiovascular;  Laterality: N/A;     Current Outpatient Prescriptions  Medication Sig Dispense Refill  . aspirin EC 81 MG tablet Take 81 mg by mouth daily.    . carbidopa-levodopa (SINEMET IR) 25-100 MG per tablet Take 3 tablets by mouth 4 (four) times daily -  with meals and at bedtime.  0  . cetirizine (ZYRTEC) 10 MG tablet Take 10 mg by mouth daily as needed for allergies.    Marland Kitchen ibuprofen (ADVIL,MOTRIN) 200 MG tablet Take 400-800 mg by mouth every 6 (six) hours as needed for pain.    Marland Kitchen MAGNESIUM PO Take by mouth. TAKE ONE PILL DAILY     No current facility-administered medications for this visit.    Allergies:   Sulfa antibiotics    Social History:  The patient  reports that he has never smoked. He does not  have any smokeless tobacco history on file. He reports that he drinks alcohol. He reports that he does not use illicit drugs.   Family History:  The patient's family history includes CAD in his father and maternal grandmother; Heart attack in his maternal grandmother; Heart disease in his father; Hyperlipidemia in his father; Hypertension in his father; Parkinsonism in his mother; Stroke in his paternal grandmother.    ROS:  Please see the history of present illness.   Otherwise, review of systems are positive for new diagnosis of Parkinson's and followed at wake portion of her sleep [facility].   All other systems are reviewed and negative.    PHYSICAL EXAM: VS:  BP 100/60 mmHg  Pulse 46  Ht  (1.93 m)  Wt 210 lb 12.8 oz (95.618 kg)  BMI 25.67 kg/m2 , BMI Body mass index is 25.67 kg/(m^2). GEN: Well nourished, well developed, in no acute distress HEENT: normal Neck: no JVD, carotid bruits, or masses Cardiac: RRR; no murmurs, rubs, or gallops,no edema  Respiratory:  clear to auscultation bilaterally, normal work of breathing GI: soft, nontender, nondistended, + BS MS: no deformity or atrophy Skin: warm and dry, no rash Neuro:  Strength and sensation are intact Psych: euthymic mood, full affect   EKG:  EKG is ordered today. The ekg ordered today demonstrates sinus bradycardia  with inferolateral T-wave abnormality unchanged from prior.   Recent Labs: 07/06/2014: ALT 21    Lipid Panel    Component Value Date/Time   CHOL 164 12/07/2014 0815   TRIG 46 12/07/2014 0815   HDL 58 12/07/2014 0815   LDLCALC 97 12/07/2014 0815      Wt Readings from Last 3 Encounters:  03/27/15 210 lb 12.8 oz (95.618 kg)  01/24/14 215 lb (97.523 kg)  11/30/13 219 lb (99.338 kg)      Other studies Reviewed: Additional studies/ records that were reviewed today include: . Review of the above records demonstrates: Lipids were last done within a year ago   ASSESSMENT AND  PLAN:  Coronary artery disease involving native coronary artery of native heart without angina pectoris: No symptoms  Hyperlipidemia - lipid NMR  Exertional angina: Resolved. Overall he has noted significant improvement in his exertional tolerance since initiating therapy for Parkinson's disease.  Parkinson's Disease: On carbidopa     Current medicines are reviewed at length with the patient today.  The patient does not have concerns regarding medicines.  The following changes have been made:  no change  Labs/ tests ordered today include: Old lipid levels were reviewed and need to be repeated   Orders Placed This Encounter  Procedures  . NMR Lipoprofile with Lipids     Disposition:   FU with Mendel RyderH. Chesney Klimaszewski in 12  yaer   Signed, Lesleigh NoeSMITH III,Kamonte Mcmichen W, MD  03/27/2015 9:27 AM    [facility] [address], [place], [place]  [address] Phone: [phone]) [phone]; Fax: [phone]

## 2015-03-27 NOTE — Patient Instructions (Addendum)
Your physician recommends that you continue on your current medications as directed. Please refer to the Current Medication list given to you today.  Lab Today:NMR Lipid  Stay Active  Your physician wants you to follow-up in: 1 year with Dr.Smith You will receive a reminder letter in the mail two months in advance. If you don't receive a letter, please call our office to schedule the follow-up appointment.

## 2015-03-29 LAB — NMR LIPOPROFILE WITH LIPIDS
Cholesterol, Total: 188 mg/dL (ref 100–199)
HDL Particle Number: 30.9 umol/L (ref >=30.5)
HDL SIZE: 9.6 nm (ref >=9.2)
HDL-C: 66 mg/dL (ref >39)
LDL (calc): 111 mg/dL — ABNORMAL HIGH (ref 0–99)
LDL PARTICLE NUMBER: 1388 nmol/L — AB (ref <1000)
LDL Size: 21.5 nm (ref >=20.8)
LP-IR Score: <25 (ref <=45)
Large HDL-P: 9.8 umol/L (ref >=4.8)
SMALL LDL PARTICLE NUMBER: 319 nmol/L (ref <=527)
TRIGLYCERIDES: 53 mg/dL (ref 0–149)
VLDL SIZE: 41.3 nm (ref <=46.6)

## 2015-04-03 ENCOUNTER — Telehealth: Payer: Self-pay

## 2015-04-03 DIAGNOSIS — E785 Hyperlipidemia, unspecified: Secondary | ICD-10-CM

## 2015-04-03 MED ORDER — ROSUVASTATIN CALCIUM 5 MG PO TABS: 5.0000 mg | ORAL_TABLET | Freq: Every day | ORAL | Status: DC

## 2015-04-03 NOTE — Telephone Encounter (Signed)
Pt aware of lab results and Dr.Smith's recommendation. Worse than 1 year ago. Probably needs low-dose statin therapy if he can tolerate it. I will consider Crestor 5 mg per day. If he agrees, will need to have a repeat lipid NMR panel and hepatic panel in 2 months Pt agreeable with plan Rx sent to pt pharmacy, lab app scheduled for 6/20 Pt verbalized understanding

## 2015-04-03 NOTE — Telephone Encounter (Signed)
-----   Message from Lyn RecordsHenry W Smith, MD sent at 03/29/2015  1:44 PM EDT ----- Worse than 1 year ago. Probably needs low-dose statin therapy if he can tolerate it. I will consider Crestor 5 mg per day. If he agrees, will need to have a repeat lipid NMR panel and hepatic panel in 2 months

## 2015-04-07 ENCOUNTER — Telehealth: Payer: Self-pay | Admitting: Interventional Cardiology

## 2015-04-07 NOTE — Telephone Encounter (Signed)
Spoke with patient about recent NMR results.

## 2015-04-07 NOTE — Telephone Encounter (Signed)
New Message       Pt has questions about his recent lab work. Please call back and advise.

## 2015-04-07 NOTE — Telephone Encounter (Signed)
Pt states he will think about Dr Michaelle CopasSmith's recommendation to start Crestor and call back if he decides he wants to take it.

## 2015-05-31 ENCOUNTER — Telehealth: Payer: Self-pay | Admitting: Interventional Cardiology

## 2015-05-31 NOTE — Telephone Encounter (Signed)
Lmom. Pt is scheduled to have fasting labs on 6/20. Pt is to call back if further assistance is needed

## 2015-05-31 NOTE — Telephone Encounter (Signed)
New message     Confirming his lab appt for monday

## 2015-06-05 ENCOUNTER — Other Ambulatory Visit (INDEPENDENT_AMBULATORY_CARE_PROVIDER_SITE_OTHER): Payer: BLUE CROSS/BLUE SHIELD | Admitting: *Deleted

## 2015-06-05 DIAGNOSIS — E785 Hyperlipidemia, unspecified: Secondary | ICD-10-CM | POA: Diagnosis not present

## 2015-06-05 LAB — ALT: ALT: 10 U/L (ref 0–53)

## 2015-06-05 NOTE — Addendum Note (Signed)
Addended by: Tonita Phoenix on: 06/05/2015 07:53 AM   Modules accepted: Orders

## 2015-06-07 ENCOUNTER — Telehealth: Payer: Self-pay

## 2015-06-07 DIAGNOSIS — E785 Hyperlipidemia, unspecified: Secondary | ICD-10-CM

## 2015-06-07 LAB — NMR LIPOPROFILE WITH LIPIDS
Cholesterol, Total: 186 mg/dL (ref 100–199)
HDL PARTICLE NUMBER: 32.8 umol/L (ref >=30.5)
HDL Size: 10 nm (ref >=9.2)
HDL-C: 70 mg/dL (ref >39)
LARGE HDL: 11.6 umol/L (ref >=4.8)
LARGE VLDL-P: 1.1 nmol/L (ref <=2.7)
LDL (calc): 106 mg/dL — ABNORMAL HIGH (ref 0–99)
LDL Particle Number: 1066 nmol/L — ABNORMAL HIGH (ref <1000)
LDL Size: 21.3 nm (ref >=20.8)
LP-IR Score: <25 (ref <=45)
Small LDL Particle Number: 187 nmol/L (ref <=527)
TRIGLYCERIDES: 52 mg/dL (ref 0–149)
VLDL Size: 43.5 nm (ref <=46.6)

## 2015-06-07 NOTE — Telephone Encounter (Signed)
-----   Message from Lyn Records, MD sent at 06/05/2015  1:13 PM EDT ----- Liver function is normal

## 2015-06-07 NOTE — Telephone Encounter (Signed)
Pt aware of lab results  Marked improvement and no change required. Repeat liver and lipid in 6-9 months. Pt verbalized understanding.

## 2015-06-07 NOTE — Telephone Encounter (Signed)
-----   Message from Lyn Records, MD sent at 06/07/2015  9:23 AM EDT ----- Marked improvement and no change required. Repeat liver and lipid in 6-9 months.

## 2015-06-07 NOTE — Telephone Encounter (Signed)
Called to give pt lab results.lmtcb  

## 2015-12-04 ENCOUNTER — Other Ambulatory Visit (INDEPENDENT_AMBULATORY_CARE_PROVIDER_SITE_OTHER): Payer: BLUE CROSS/BLUE SHIELD | Admitting: *Deleted

## 2015-12-04 DIAGNOSIS — E785 Hyperlipidemia, unspecified: Secondary | ICD-10-CM | POA: Diagnosis not present

## 2015-12-04 NOTE — Addendum Note (Signed)
Addended by: Sydnee CabalMACK, Geraldine Tesar R on: 12/04/2015 08:53 AM   Modules accepted: Orders

## 2015-12-05 LAB — LIPID PANEL
CHOLESTEROL: 176 mg/dL (ref 125–200)
HDL: 67 mg/dL (ref >=40)
LDL Cholesterol: 98 mg/dL (ref <130)
TRIGLYCERIDES: 54 mg/dL (ref <150)
Total CHOL/HDL Ratio: 2.6 Ratio (ref <=5.0)
VLDL: 11 mg/dL (ref <30)

## 2015-12-05 LAB — HEPATIC FUNCTION PANEL
ALT: 12 U/L (ref 9–46)
AST: 25 U/L (ref 10–35)
Albumin: 4.2 g/dL (ref 3.6–5.1)
Alkaline Phosphatase: 54 U/L (ref 40–115)
BILIRUBIN INDIRECT: 0.5 mg/dL (ref 0.2–1.2)
BILIRUBIN TOTAL: 0.6 mg/dL (ref 0.2–1.2)
Bilirubin, Direct: 0.1 mg/dL (ref <=0.2)
Total Protein: 6.7 g/dL (ref 6.1–8.1)

## 2016-02-06 ENCOUNTER — Encounter: Payer: Self-pay | Admitting: Interventional Cardiology

## 2016-02-15 ENCOUNTER — Other Ambulatory Visit: Payer: Self-pay | Admitting: Family Medicine

## 2016-02-15 DIAGNOSIS — J019 Acute sinusitis, unspecified: Secondary | ICD-10-CM

## 2016-02-23 ENCOUNTER — Other Ambulatory Visit: Payer: BLUE CROSS/BLUE SHIELD

## 2017-01-15 DIAGNOSIS — G20 Parkinson's disease: Secondary | ICD-10-CM | POA: Diagnosis not present

## 2017-02-12 DIAGNOSIS — F341 Dysthymic disorder: Secondary | ICD-10-CM | POA: Diagnosis not present

## 2017-02-28 DIAGNOSIS — F341 Dysthymic disorder: Secondary | ICD-10-CM | POA: Diagnosis not present

## 2017-03-28 DIAGNOSIS — F341 Dysthymic disorder: Secondary | ICD-10-CM | POA: Diagnosis not present

## 2017-04-22 DIAGNOSIS — F341 Dysthymic disorder: Secondary | ICD-10-CM | POA: Diagnosis not present

## 2017-05-19 DIAGNOSIS — F341 Dysthymic disorder: Secondary | ICD-10-CM | POA: Diagnosis not present

## 2017-06-24 DIAGNOSIS — F341 Dysthymic disorder: Secondary | ICD-10-CM | POA: Diagnosis not present

## 2017-07-18 DIAGNOSIS — S83241A Other tear of medial meniscus, current injury, right knee, initial encounter: Secondary | ICD-10-CM | POA: Diagnosis not present

## 2017-07-29 DIAGNOSIS — F341 Dysthymic disorder: Secondary | ICD-10-CM | POA: Diagnosis not present

## 2017-08-06 DIAGNOSIS — N50819 Testicular pain, unspecified: Secondary | ICD-10-CM | POA: Diagnosis not present

## 2017-08-07 ENCOUNTER — Other Ambulatory Visit: Payer: Self-pay | Admitting: Family Medicine

## 2017-08-07 ENCOUNTER — Other Ambulatory Visit (HOSPITAL_BASED_OUTPATIENT_CLINIC_OR_DEPARTMENT_OTHER): Payer: Self-pay | Admitting: Family Medicine

## 2017-08-07 ENCOUNTER — Ambulatory Visit (HOSPITAL_BASED_OUTPATIENT_CLINIC_OR_DEPARTMENT_OTHER)
Admission: RE | Admit: 2017-08-07 | Discharge: 2017-08-07 | Disposition: A | Discharge status: Home / Self Care | Payer: BLUE CROSS/BLUE SHIELD | Source: Ambulatory Visit | Attending: Family Medicine | Admitting: Family Medicine

## 2017-08-07 ENCOUNTER — Ambulatory Visit (HOSPITAL_BASED_OUTPATIENT_CLINIC_OR_DEPARTMENT_OTHER): Admission: RE | Admit: 2017-08-07 | Payer: BLUE CROSS/BLUE SHIELD | Source: Ambulatory Visit

## 2017-08-07 DIAGNOSIS — N433 Hydrocele, unspecified: Secondary | ICD-10-CM | POA: Insufficient documentation

## 2017-08-07 DIAGNOSIS — I861 Scrotal varices: Secondary | ICD-10-CM | POA: Insufficient documentation

## 2017-08-07 DIAGNOSIS — N50812 Left testicular pain: Secondary | ICD-10-CM | POA: Diagnosis present

## 2017-08-07 DIAGNOSIS — N50819 Testicular pain, unspecified: Secondary | ICD-10-CM

## 2017-08-07 DIAGNOSIS — N5089 Other specified disorders of the male genital organs: Secondary | ICD-10-CM | POA: Diagnosis not present

## 2017-08-11 ENCOUNTER — Other Ambulatory Visit: Payer: BLUE CROSS/BLUE SHIELD

## 2017-08-19 DIAGNOSIS — G20 Parkinson's disease: Secondary | ICD-10-CM | POA: Diagnosis not present

## 2017-08-19 DIAGNOSIS — F329 Major depressive disorder, single episode, unspecified: Secondary | ICD-10-CM | POA: Diagnosis not present

## 2017-08-19 DIAGNOSIS — Z79899 Other long term (current) drug therapy: Secondary | ICD-10-CM | POA: Diagnosis not present

## 2017-08-26 DIAGNOSIS — F341 Dysthymic disorder: Secondary | ICD-10-CM | POA: Diagnosis not present

## 2017-09-24 DIAGNOSIS — F341 Dysthymic disorder: Secondary | ICD-10-CM | POA: Diagnosis not present

## 2017-09-29 DIAGNOSIS — I2510 Atherosclerotic heart disease of native coronary artery without angina pectoris: Secondary | ICD-10-CM | POA: Diagnosis not present

## 2017-09-29 DIAGNOSIS — R0789 Other chest pain: Secondary | ICD-10-CM | POA: Diagnosis not present

## 2017-09-29 DIAGNOSIS — I951 Orthostatic hypotension: Secondary | ICD-10-CM | POA: Diagnosis not present

## 2017-09-29 DIAGNOSIS — Z0000 Encounter for general adult medical examination without abnormal findings: Secondary | ICD-10-CM | POA: Diagnosis not present

## 2017-09-29 DIAGNOSIS — N289 Disorder of kidney and ureter, unspecified: Secondary | ICD-10-CM | POA: Diagnosis not present

## 2017-10-28 DIAGNOSIS — F341 Dysthymic disorder: Secondary | ICD-10-CM | POA: Diagnosis not present

## 2017-12-01 DIAGNOSIS — F341 Dysthymic disorder: Secondary | ICD-10-CM | POA: Diagnosis not present

## 2018-01-06 DIAGNOSIS — F341 Dysthymic disorder: Secondary | ICD-10-CM | POA: Diagnosis not present

## 2018-02-10 DIAGNOSIS — F341 Dysthymic disorder: Secondary | ICD-10-CM | POA: Diagnosis not present

## 2018-03-18 DIAGNOSIS — F341 Dysthymic disorder: Secondary | ICD-10-CM | POA: Diagnosis not present

## 2018-03-23 IMAGING — US US ART/VEN ABD/PELV/SCROTUM DOPPLER LTD
1 series · 14 of 25 positions shown · non-contrast
Comparison: None.

CLINICAL DATA: Left testicular discomfort for 4 days.

EXAM:
SCROTAL ULTRASOUND
DOPPLER ULTRASOUND OF THE TESTICLES
TECHNIQUE: Complete ultrasound examination of the testicles, epididymis, and
other scrotal structures was performed. Color and spectral Doppler
ultrasound were also utilized to evaluate blood flow to the
testicles.

[Series 1: us art/ven abd/pelv/scrotum doppler ltd · 0.07mm/px · 14 of 37 slices shown]
[im 1/37]
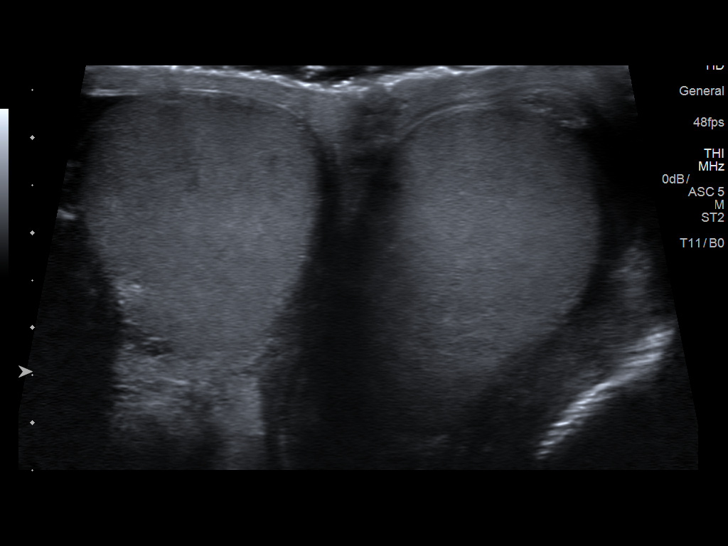
[im 4/37]
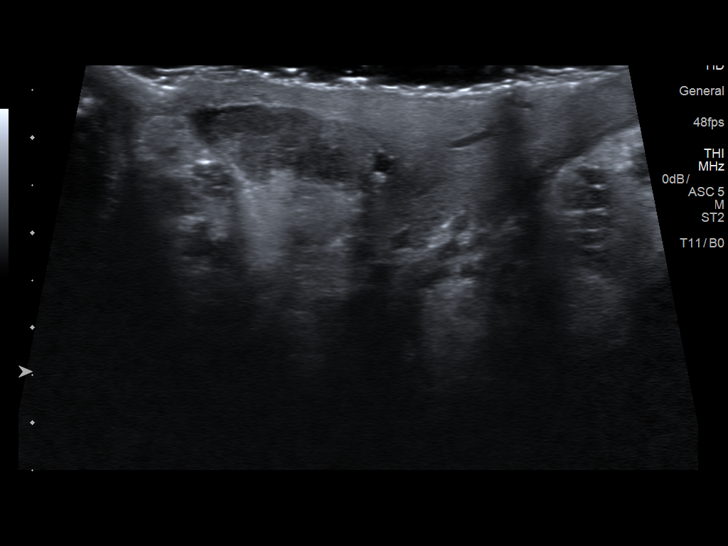
[im 7/37]
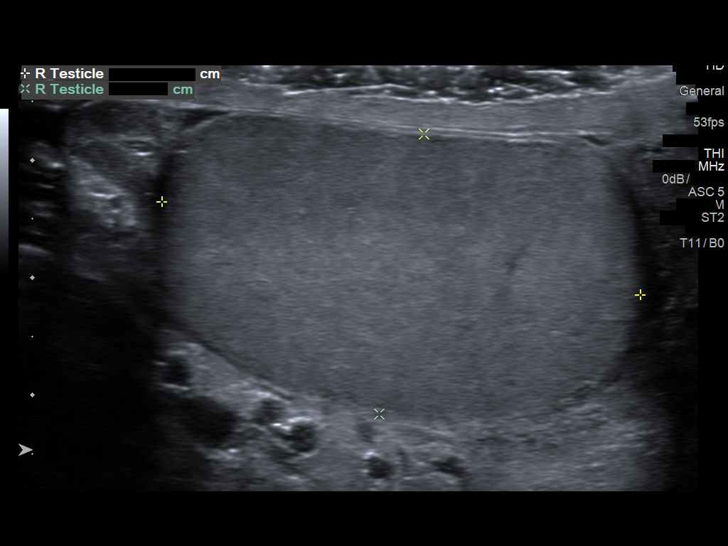
[im 10/37]
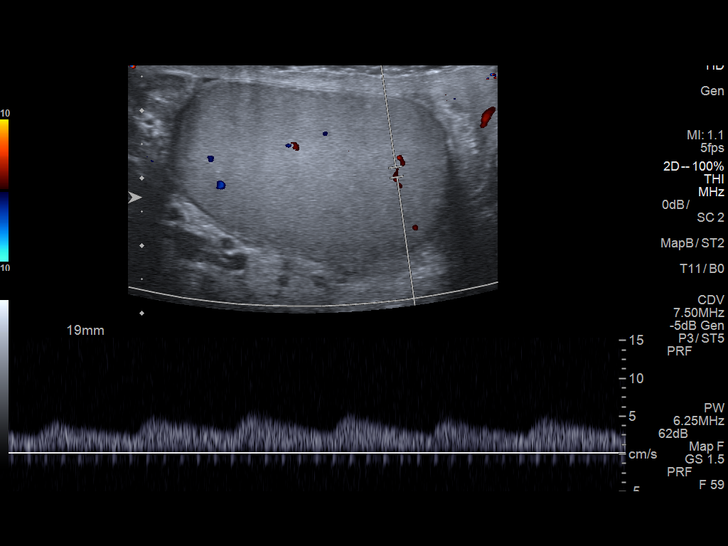
[im 13/37]
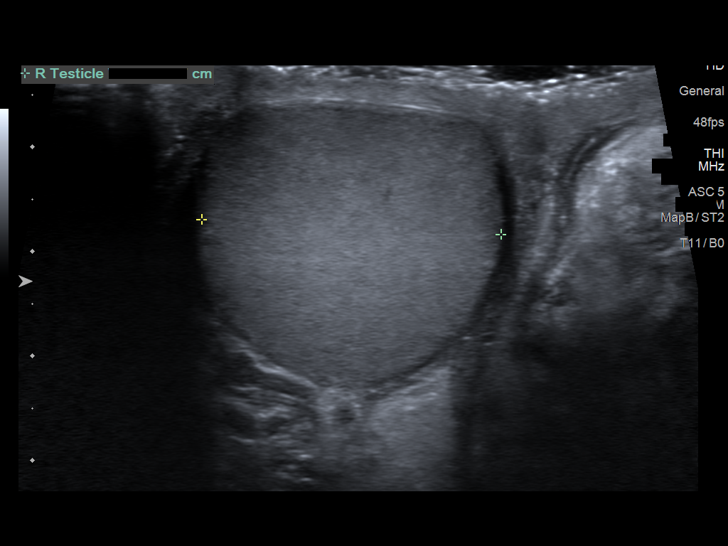
[im 14/37]
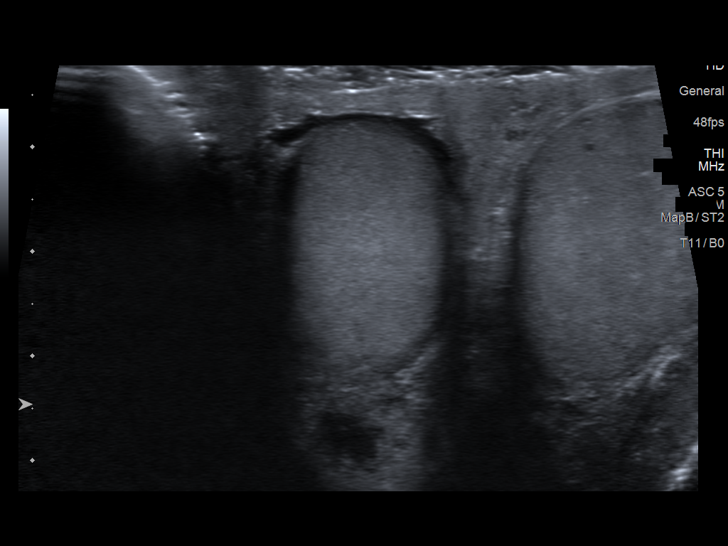
[im 17/37]
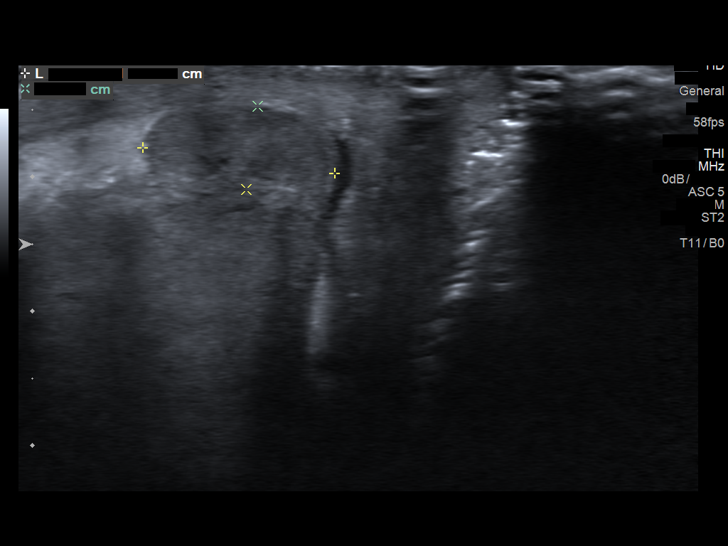
[im 20/37]
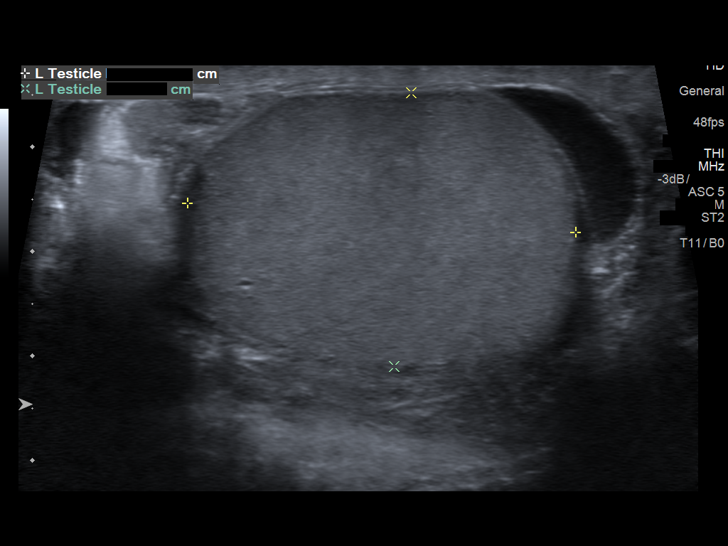
[im 23/37]
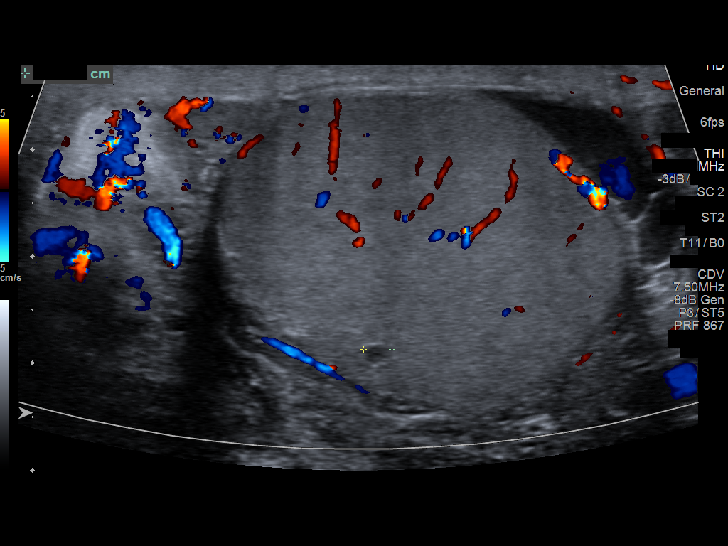
[im 25/37]
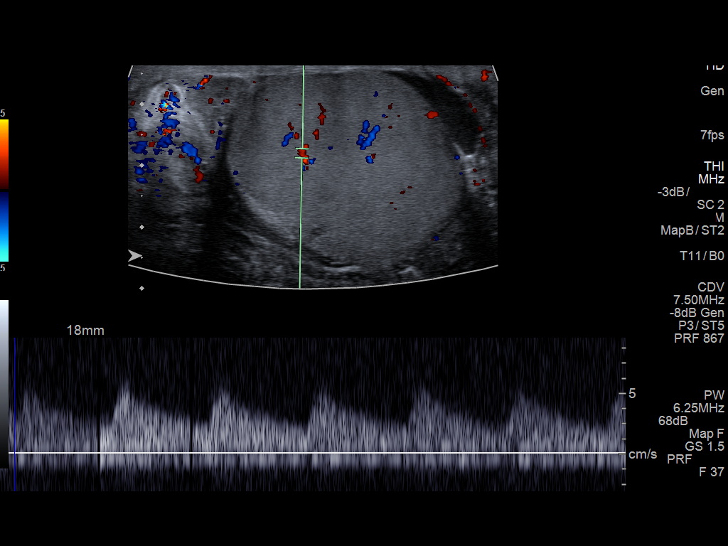
[im 28/37]
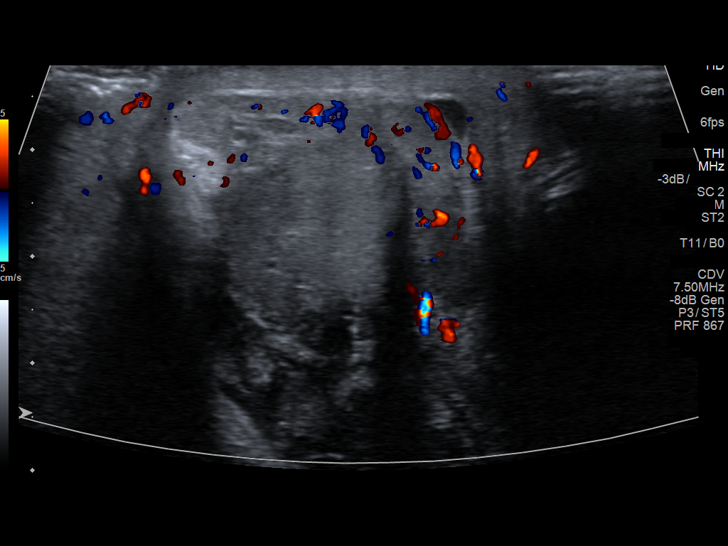
[im 31/37]
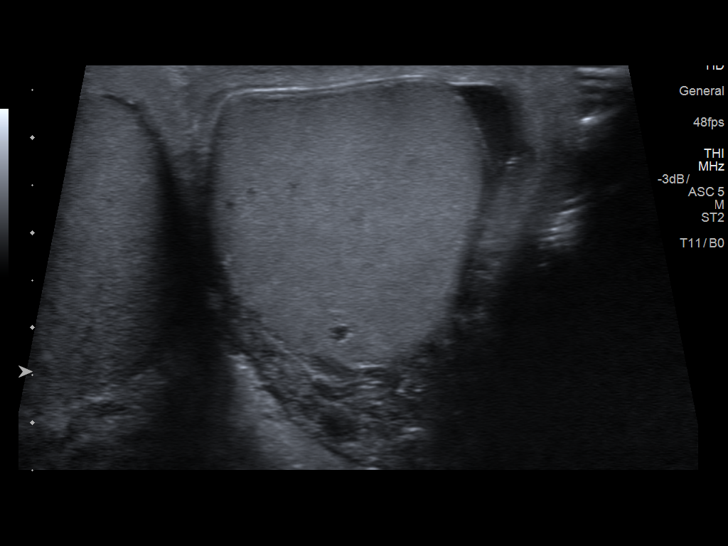
[im 34/37]
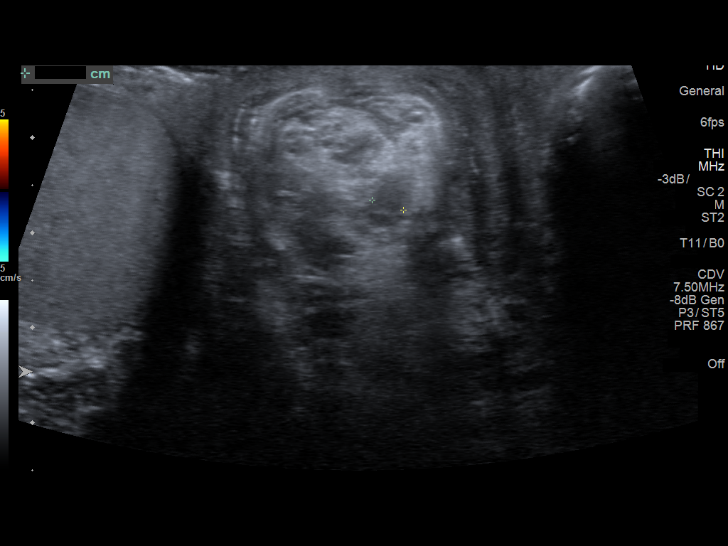
[im 37/37]
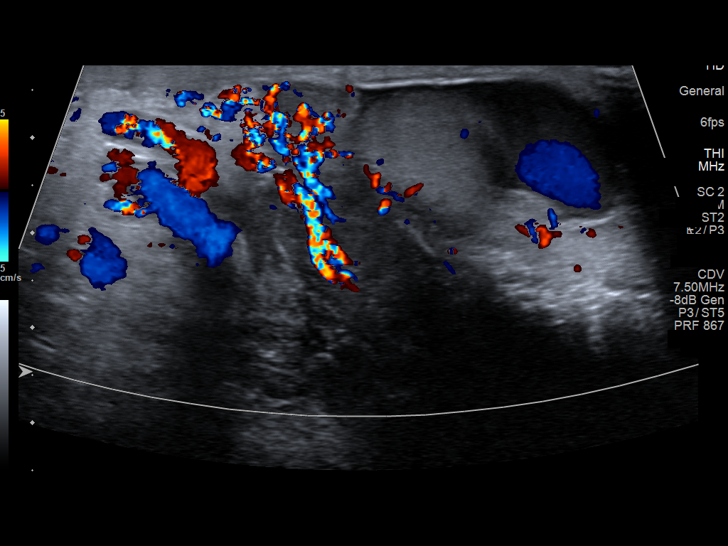

[14 of 25 positions shown; findings below may reference images not displayed]

FINDINGS: Right testicle

Measurements: 4.2 x 2.4 x 2.9 cm. No mass or microlithiasis
visualized.

Left testicle

Measurements: 3.7 x 2.6 x 2.9 cm. No mass or microlithiasis
visualized.

Right epididymis: There is a tiny cyst in the right epididymal head.

Left epididymis: The left epididymis appears larger than the right
and is hypervascular.

Hydrocele:  There is a left-sided hydrocele.

Varicocele:  There is a left-sided varicocele.

Pulsed Doppler interrogation of both testes demonstrates normal low
resistance arterial and venous waveforms bilaterally.
IMPRESSION: 1. The left epididymis is enlarged compared to the right and
hyperemic consistent with epididymitis, likely explaining the
patient's symptoms.
2. There is a left-sided varicocele.
3. There is a small left hydrocele.

## 2018-04-13 DIAGNOSIS — G20 Parkinson's disease: Secondary | ICD-10-CM | POA: Diagnosis not present

## 2018-05-15 DIAGNOSIS — F341 Dysthymic disorder: Secondary | ICD-10-CM | POA: Diagnosis not present

## 2018-06-16 DIAGNOSIS — F341 Dysthymic disorder: Secondary | ICD-10-CM | POA: Diagnosis not present

## 2018-07-28 DIAGNOSIS — F341 Dysthymic disorder: Secondary | ICD-10-CM | POA: Diagnosis not present

## 2018-08-26 DIAGNOSIS — F341 Dysthymic disorder: Secondary | ICD-10-CM | POA: Diagnosis not present

## 2018-09-22 DIAGNOSIS — F341 Dysthymic disorder: Secondary | ICD-10-CM | POA: Diagnosis not present

## 2018-10-21 DIAGNOSIS — F341 Dysthymic disorder: Secondary | ICD-10-CM | POA: Diagnosis not present

## 2018-10-26 DIAGNOSIS — G20 Parkinson's disease: Secondary | ICD-10-CM | POA: Diagnosis not present

## 2018-11-10 DIAGNOSIS — I2510 Atherosclerotic heart disease of native coronary artery without angina pectoris: Secondary | ICD-10-CM | POA: Diagnosis not present

## 2018-11-10 DIAGNOSIS — N289 Disorder of kidney and ureter, unspecified: Secondary | ICD-10-CM | POA: Diagnosis not present

## 2018-11-10 DIAGNOSIS — Z0000 Encounter for general adult medical examination without abnormal findings: Secondary | ICD-10-CM | POA: Diagnosis not present

## 2018-11-10 DIAGNOSIS — G20 Parkinson's disease: Secondary | ICD-10-CM | POA: Diagnosis not present

## 2018-11-25 DIAGNOSIS — F341 Dysthymic disorder: Secondary | ICD-10-CM | POA: Diagnosis not present

## 2019-01-12 DIAGNOSIS — F341 Dysthymic disorder: Secondary | ICD-10-CM | POA: Diagnosis not present

## 2019-02-16 DIAGNOSIS — F341 Dysthymic disorder: Secondary | ICD-10-CM | POA: Diagnosis not present

## 2019-04-01 DIAGNOSIS — F341 Dysthymic disorder: Secondary | ICD-10-CM | POA: Diagnosis not present

## 2019-04-27 DIAGNOSIS — F341 Dysthymic disorder: Secondary | ICD-10-CM | POA: Diagnosis not present

## 2019-05-21 DIAGNOSIS — G20 Parkinson's disease: Secondary | ICD-10-CM | POA: Diagnosis not present

## 2019-06-03 DIAGNOSIS — F341 Dysthymic disorder: Secondary | ICD-10-CM | POA: Diagnosis not present

## 2019-08-05 DIAGNOSIS — F341 Dysthymic disorder: Secondary | ICD-10-CM | POA: Diagnosis not present

## 2019-09-16 DIAGNOSIS — F341 Dysthymic disorder: Secondary | ICD-10-CM | POA: Diagnosis not present

## 2019-11-15 DIAGNOSIS — Z0000 Encounter for general adult medical examination without abnormal findings: Secondary | ICD-10-CM | POA: Diagnosis not present

## 2019-11-29 DIAGNOSIS — M25551 Pain in right hip: Secondary | ICD-10-CM | POA: Diagnosis not present

## 2019-11-29 DIAGNOSIS — M5416 Radiculopathy, lumbar region: Secondary | ICD-10-CM | POA: Diagnosis not present

## 2019-12-01 DIAGNOSIS — M5416 Radiculopathy, lumbar region: Secondary | ICD-10-CM | POA: Diagnosis not present

## 2019-12-07 DIAGNOSIS — M5416 Radiculopathy, lumbar region: Secondary | ICD-10-CM | POA: Diagnosis not present

## 2019-12-08 DIAGNOSIS — M545 Low back pain: Secondary | ICD-10-CM | POA: Diagnosis not present

## 2019-12-13 DIAGNOSIS — M5416 Radiculopathy, lumbar region: Secondary | ICD-10-CM | POA: Diagnosis not present

## 2019-12-15 DIAGNOSIS — M5416 Radiculopathy, lumbar region: Secondary | ICD-10-CM | POA: Diagnosis not present

## 2019-12-21 DIAGNOSIS — M5416 Radiculopathy, lumbar region: Secondary | ICD-10-CM | POA: Diagnosis not present

## 2019-12-23 DIAGNOSIS — M5416 Radiculopathy, lumbar region: Secondary | ICD-10-CM | POA: Diagnosis not present

## 2019-12-28 DIAGNOSIS — M5416 Radiculopathy, lumbar region: Secondary | ICD-10-CM | POA: Diagnosis not present

## 2019-12-29 DIAGNOSIS — M5416 Radiculopathy, lumbar region: Secondary | ICD-10-CM | POA: Diagnosis not present

## 2020-01-04 DIAGNOSIS — M5416 Radiculopathy, lumbar region: Secondary | ICD-10-CM | POA: Diagnosis not present

## 2020-01-06 DIAGNOSIS — M5416 Radiculopathy, lumbar region: Secondary | ICD-10-CM | POA: Diagnosis not present

## 2020-01-11 DIAGNOSIS — M5416 Radiculopathy, lumbar region: Secondary | ICD-10-CM | POA: Diagnosis not present

## 2020-01-13 DIAGNOSIS — M5430 Sciatica, unspecified side: Secondary | ICD-10-CM | POA: Diagnosis not present

## 2020-01-13 DIAGNOSIS — G20 Parkinson's disease: Secondary | ICD-10-CM | POA: Diagnosis not present

## 2020-01-13 DIAGNOSIS — M5416 Radiculopathy, lumbar region: Secondary | ICD-10-CM | POA: Diagnosis not present

## 2020-03-16 ENCOUNTER — Ambulatory Visit: Payer: BC Managed Care – PPO | Attending: Internal Medicine

## 2020-03-16 DIAGNOSIS — Z23 Encounter for immunization: Secondary | ICD-10-CM

## 2020-03-16 NOTE — Progress Notes (Signed)
   Covid-19 Vaccination Clinic  Name:  Tyler Castro    MRN: [id number] DOB: 1956-07-19  03/16/2020  [name] was observed post Covid-19 immunization for 15 minutes without incident. He was provided with Vaccine Information Sheet and instruction to access the V-Safe system.   [name] was instructed to call 911 with any severe reactions post vaccine: Marland Kitchen Difficulty breathing  . Swelling of face and throat  . A fast heartbeat  . A bad rash all over body  . Dizziness and weakness   Immunizations Administered    Name Date Dose VIS Date Route   Pfizer COVID-19 Vaccine 03/16/2020 12:49 PM 0.3 mL 11/26/2019 Intramuscular   Manufacturer: [employer], [employer]   Lot: [id number]   NDC: [address]-6

## 2020-04-10 ENCOUNTER — Ambulatory Visit: Payer: BC Managed Care – PPO | Attending: Internal Medicine

## 2020-04-10 DIAGNOSIS — Z23 Encounter for immunization: Secondary | ICD-10-CM

## 2020-04-10 NOTE — Progress Notes (Signed)
   Covid-19 Vaccination Clinic  Name:  Tyler Castro    MRN: [id number] DOB: 03-19-1956  04/10/2020  Mr. Tyler Castro was observed post Covid-19 immunization for 15 minutes without incident. He was provided with Vaccine Information Sheet and instruction to access the V-Safe system.   Mr. Tyler Castro was instructed to call 911 with any severe reactions post vaccine: Marland Kitchen Difficulty breathing  . Swelling of face and throat  . A fast heartbeat  . A bad rash all over body  . Dizziness and weakness   Immunizations Administered    Name Date Dose VIS Date Route   Pfizer COVID-19 Vaccine 04/10/2020  9:57 AM 0.3 mL 02/09/2019 Intramuscular   Manufacturer: [employer], [employer]   Lot: [id number]   NDC: [address]-9

## 2020-04-11 DIAGNOSIS — M545 Low back pain: Secondary | ICD-10-CM | POA: Diagnosis not present

## 2020-04-13 DIAGNOSIS — M6289 Other specified disorders of muscle: Secondary | ICD-10-CM | POA: Diagnosis not present

## 2020-04-13 DIAGNOSIS — M6281 Muscle weakness (generalized): Secondary | ICD-10-CM | POA: Diagnosis not present

## 2020-04-13 DIAGNOSIS — M48061 Spinal stenosis, lumbar region without neurogenic claudication: Secondary | ICD-10-CM | POA: Diagnosis not present

## 2020-04-18 DIAGNOSIS — M47816 Spondylosis without myelopathy or radiculopathy, lumbar region: Secondary | ICD-10-CM | POA: Diagnosis not present

## 2020-04-27 DIAGNOSIS — M6289 Other specified disorders of muscle: Secondary | ICD-10-CM | POA: Diagnosis not present

## 2020-04-27 DIAGNOSIS — M48061 Spinal stenosis, lumbar region without neurogenic claudication: Secondary | ICD-10-CM | POA: Diagnosis not present

## 2020-04-27 DIAGNOSIS — M6281 Muscle weakness (generalized): Secondary | ICD-10-CM | POA: Diagnosis not present

## 2020-05-04 DIAGNOSIS — M6289 Other specified disorders of muscle: Secondary | ICD-10-CM | POA: Diagnosis not present

## 2020-05-04 DIAGNOSIS — M48061 Spinal stenosis, lumbar region without neurogenic claudication: Secondary | ICD-10-CM | POA: Diagnosis not present

## 2020-05-04 DIAGNOSIS — M6281 Muscle weakness (generalized): Secondary | ICD-10-CM | POA: Diagnosis not present

## 2020-05-16 DIAGNOSIS — M47816 Spondylosis without myelopathy or radiculopathy, lumbar region: Secondary | ICD-10-CM | POA: Diagnosis not present

## 2020-05-19 DIAGNOSIS — M6281 Muscle weakness (generalized): Secondary | ICD-10-CM | POA: Diagnosis not present

## 2020-05-19 DIAGNOSIS — M6289 Other specified disorders of muscle: Secondary | ICD-10-CM | POA: Diagnosis not present

## 2020-05-19 DIAGNOSIS — M48061 Spinal stenosis, lumbar region without neurogenic claudication: Secondary | ICD-10-CM | POA: Diagnosis not present

## 2020-07-10 DIAGNOSIS — G20 Parkinson's disease: Secondary | ICD-10-CM | POA: Diagnosis not present

## 2020-09-08 DIAGNOSIS — G20 Parkinson's disease: Secondary | ICD-10-CM | POA: Diagnosis not present

## 2020-09-08 DIAGNOSIS — Z79899 Other long term (current) drug therapy: Secondary | ICD-10-CM | POA: Diagnosis not present

## 2020-11-15 DIAGNOSIS — I2510 Atherosclerotic heart disease of native coronary artery without angina pectoris: Secondary | ICD-10-CM | POA: Diagnosis not present

## 2020-11-15 DIAGNOSIS — Z0000 Encounter for general adult medical examination without abnormal findings: Secondary | ICD-10-CM | POA: Diagnosis not present

## 2020-11-15 DIAGNOSIS — Z23 Encounter for immunization: Secondary | ICD-10-CM | POA: Diagnosis not present

## 2021-02-06 DIAGNOSIS — G20 Parkinson's disease: Secondary | ICD-10-CM | POA: Diagnosis not present

## 2021-02-13 DIAGNOSIS — Z23 Encounter for immunization: Secondary | ICD-10-CM | POA: Diagnosis not present

## 2021-05-17 DIAGNOSIS — G20 Parkinson's disease: Secondary | ICD-10-CM | POA: Diagnosis not present

## 2021-08-17 DIAGNOSIS — G20 Parkinson's disease: Secondary | ICD-10-CM | POA: Diagnosis not present

## 2021-09-04 DIAGNOSIS — M9903 Segmental and somatic dysfunction of lumbar region: Secondary | ICD-10-CM | POA: Diagnosis not present

## 2021-09-04 DIAGNOSIS — M5136 Other intervertebral disc degeneration, lumbar region: Secondary | ICD-10-CM | POA: Diagnosis not present

## 2021-09-04 DIAGNOSIS — M5416 Radiculopathy, lumbar region: Secondary | ICD-10-CM | POA: Diagnosis not present

## 2021-09-04 DIAGNOSIS — M9905 Segmental and somatic dysfunction of pelvic region: Secondary | ICD-10-CM | POA: Diagnosis not present

## 2021-09-06 DIAGNOSIS — M5136 Other intervertebral disc degeneration, lumbar region: Secondary | ICD-10-CM | POA: Diagnosis not present

## 2021-09-06 DIAGNOSIS — M9903 Segmental and somatic dysfunction of lumbar region: Secondary | ICD-10-CM | POA: Diagnosis not present

## 2021-09-06 DIAGNOSIS — M5416 Radiculopathy, lumbar region: Secondary | ICD-10-CM | POA: Diagnosis not present

## 2021-09-06 DIAGNOSIS — M9905 Segmental and somatic dysfunction of pelvic region: Secondary | ICD-10-CM | POA: Diagnosis not present

## 2021-09-11 DIAGNOSIS — M5136 Other intervertebral disc degeneration, lumbar region: Secondary | ICD-10-CM | POA: Diagnosis not present

## 2021-09-11 DIAGNOSIS — M9903 Segmental and somatic dysfunction of lumbar region: Secondary | ICD-10-CM | POA: Diagnosis not present

## 2021-09-11 DIAGNOSIS — M7918 Myalgia, other site: Secondary | ICD-10-CM | POA: Diagnosis not present

## 2021-09-11 DIAGNOSIS — M9905 Segmental and somatic dysfunction of pelvic region: Secondary | ICD-10-CM | POA: Diagnosis not present

## 2021-09-11 DIAGNOSIS — M5416 Radiculopathy, lumbar region: Secondary | ICD-10-CM | POA: Diagnosis not present

## 2021-09-13 DIAGNOSIS — M5136 Other intervertebral disc degeneration, lumbar region: Secondary | ICD-10-CM | POA: Diagnosis not present

## 2021-09-13 DIAGNOSIS — M9905 Segmental and somatic dysfunction of pelvic region: Secondary | ICD-10-CM | POA: Diagnosis not present

## 2021-09-13 DIAGNOSIS — M9903 Segmental and somatic dysfunction of lumbar region: Secondary | ICD-10-CM | POA: Diagnosis not present

## 2021-09-13 DIAGNOSIS — M5416 Radiculopathy, lumbar region: Secondary | ICD-10-CM | POA: Diagnosis not present

## 2021-09-18 DIAGNOSIS — M5136 Other intervertebral disc degeneration, lumbar region: Secondary | ICD-10-CM | POA: Diagnosis not present

## 2021-09-18 DIAGNOSIS — M9903 Segmental and somatic dysfunction of lumbar region: Secondary | ICD-10-CM | POA: Diagnosis not present

## 2021-09-18 DIAGNOSIS — M9905 Segmental and somatic dysfunction of pelvic region: Secondary | ICD-10-CM | POA: Diagnosis not present

## 2021-09-18 DIAGNOSIS — M5416 Radiculopathy, lumbar region: Secondary | ICD-10-CM | POA: Diagnosis not present

## 2021-09-27 DIAGNOSIS — M9903 Segmental and somatic dysfunction of lumbar region: Secondary | ICD-10-CM | POA: Diagnosis not present

## 2021-09-27 DIAGNOSIS — M9905 Segmental and somatic dysfunction of pelvic region: Secondary | ICD-10-CM | POA: Diagnosis not present

## 2021-09-27 DIAGNOSIS — M5136 Other intervertebral disc degeneration, lumbar region: Secondary | ICD-10-CM | POA: Diagnosis not present

## 2021-09-27 DIAGNOSIS — M5416 Radiculopathy, lumbar region: Secondary | ICD-10-CM | POA: Diagnosis not present

## 2021-10-04 DIAGNOSIS — M5416 Radiculopathy, lumbar region: Secondary | ICD-10-CM | POA: Diagnosis not present

## 2021-10-04 DIAGNOSIS — M5136 Other intervertebral disc degeneration, lumbar region: Secondary | ICD-10-CM | POA: Diagnosis not present

## 2021-10-04 DIAGNOSIS — M9903 Segmental and somatic dysfunction of lumbar region: Secondary | ICD-10-CM | POA: Diagnosis not present

## 2021-10-04 DIAGNOSIS — M9905 Segmental and somatic dysfunction of pelvic region: Secondary | ICD-10-CM | POA: Diagnosis not present

## 2021-10-16 DIAGNOSIS — M9903 Segmental and somatic dysfunction of lumbar region: Secondary | ICD-10-CM | POA: Diagnosis not present

## 2021-10-16 DIAGNOSIS — M9905 Segmental and somatic dysfunction of pelvic region: Secondary | ICD-10-CM | POA: Diagnosis not present

## 2021-10-16 DIAGNOSIS — M5136 Other intervertebral disc degeneration, lumbar region: Secondary | ICD-10-CM | POA: Diagnosis not present

## 2021-10-16 DIAGNOSIS — M5416 Radiculopathy, lumbar region: Secondary | ICD-10-CM | POA: Diagnosis not present

## 2021-10-31 DIAGNOSIS — M9903 Segmental and somatic dysfunction of lumbar region: Secondary | ICD-10-CM | POA: Diagnosis not present

## 2021-10-31 DIAGNOSIS — M9905 Segmental and somatic dysfunction of pelvic region: Secondary | ICD-10-CM | POA: Diagnosis not present

## 2021-10-31 DIAGNOSIS — M5416 Radiculopathy, lumbar region: Secondary | ICD-10-CM | POA: Diagnosis not present

## 2021-10-31 DIAGNOSIS — M5136 Other intervertebral disc degeneration, lumbar region: Secondary | ICD-10-CM | POA: Diagnosis not present

## 2021-11-14 DIAGNOSIS — M9905 Segmental and somatic dysfunction of pelvic region: Secondary | ICD-10-CM | POA: Diagnosis not present

## 2021-11-14 DIAGNOSIS — M5136 Other intervertebral disc degeneration, lumbar region: Secondary | ICD-10-CM | POA: Diagnosis not present

## 2021-11-14 DIAGNOSIS — M5416 Radiculopathy, lumbar region: Secondary | ICD-10-CM | POA: Diagnosis not present

## 2021-11-14 DIAGNOSIS — M9903 Segmental and somatic dysfunction of lumbar region: Secondary | ICD-10-CM | POA: Diagnosis not present

## 2021-11-20 DIAGNOSIS — Z0000 Encounter for general adult medical examination without abnormal findings: Secondary | ICD-10-CM | POA: Diagnosis not present

## 2021-11-20 DIAGNOSIS — I2510 Atherosclerotic heart disease of native coronary artery without angina pectoris: Secondary | ICD-10-CM | POA: Diagnosis not present

## 2021-11-20 DIAGNOSIS — R899 Unspecified abnormal finding in specimens from other organs, systems and tissues: Secondary | ICD-10-CM | POA: Diagnosis not present

## 2021-11-26 ENCOUNTER — Other Ambulatory Visit (HOSPITAL_COMMUNITY): Payer: Self-pay | Admitting: Family Medicine

## 2021-11-26 DIAGNOSIS — I25119 Atherosclerotic heart disease of native coronary artery with unspecified angina pectoris: Secondary | ICD-10-CM

## 2021-12-06 ENCOUNTER — Telehealth (HOSPITAL_COMMUNITY): Payer: Self-pay | Admitting: *Deleted

## 2021-12-06 NOTE — Telephone Encounter (Signed)
Close encounter 

## 2021-12-11 DIAGNOSIS — U071 COVID-19: Secondary | ICD-10-CM | POA: Diagnosis not present

## 2021-12-12 ENCOUNTER — Ambulatory Visit (HOSPITAL_COMMUNITY)
Admission: RE | Admit: 2021-12-12 | Payer: BC Managed Care – PPO | Source: Ambulatory Visit | Attending: Family Medicine | Admitting: Family Medicine

## 2022-01-11 ENCOUNTER — Telehealth (HOSPITAL_COMMUNITY): Payer: Self-pay | Admitting: *Deleted

## 2022-01-11 NOTE — Telephone Encounter (Signed)
Patient given detailed instructions per Myocardial Perfusion Study Information Sheet for the test on 01/18/2022 at 10:30. Patient notified to arrive 15 minutes early and that it is imperative to arrive on time for appointment to keep from having the test rescheduled.  If you need to cancel or reschedule your appointment, please call the office within 24 hours of your appointment. . Patient verbalized understanding.Tyler Castro

## 2022-01-18 ENCOUNTER — Other Ambulatory Visit: Payer: Self-pay

## 2022-01-18 ENCOUNTER — Ambulatory Visit (HOSPITAL_COMMUNITY): Payer: BC Managed Care – PPO | Attending: Family Medicine

## 2022-01-18 DIAGNOSIS — I25119 Atherosclerotic heart disease of native coronary artery with unspecified angina pectoris: Secondary | ICD-10-CM | POA: Diagnosis not present

## 2022-01-18 LAB — MYOCARDIAL PERFUSION IMAGING
LV dias vol: 112 mL (ref 62–150)
LV sys vol: 48 mL
Nuc Stress EF: 57 %
Peak HR: 88 {beats}/min
Rest HR: 60 {beats}/min
Rest Nuclear Isotope Dose: 10.1 mCi
SDS: 0
SRS: 0
SSS: 0
ST Depression (mm): 0 mm
Stress Nuclear Isotope Dose: 30.6 mCi
TID: 0.92

## 2022-01-18 MED ORDER — TECHNETIUM TC 99M TETROFOSMIN IV KIT
30.6000 | PACK | Freq: Once | INTRAVENOUS | Status: AC | PRN
  Administered 2022-01-18: 30.6 via INTRAVENOUS
  Filled 2022-01-18: qty 31

## 2022-01-18 MED ORDER — REGADENOSON 0.4 MG/5ML IV SOLN
0.4000 mg | Freq: Once | INTRAVENOUS | Status: AC
  Administered 2022-01-18: 0.4 mg via INTRAVENOUS

## 2022-01-18 MED ORDER — TECHNETIUM TC 99M TETROFOSMIN IV KIT
10.1000 | PACK | Freq: Once | INTRAVENOUS | Status: AC | PRN
  Administered 2022-01-18: 10.1 via INTRAVENOUS
  Filled 2022-01-18: qty 11

## 2022-02-14 DIAGNOSIS — G20 Parkinson's disease: Secondary | ICD-10-CM | POA: Diagnosis not present

## 2022-04-18 DIAGNOSIS — G20 Parkinson's disease: Secondary | ICD-10-CM | POA: Diagnosis not present

## 2022-04-18 DIAGNOSIS — R202 Paresthesia of skin: Secondary | ICD-10-CM | POA: Diagnosis not present

## 2022-08-08 DIAGNOSIS — Z79899 Other long term (current) drug therapy: Secondary | ICD-10-CM | POA: Diagnosis not present

## 2022-08-08 DIAGNOSIS — G20 Parkinson's disease: Secondary | ICD-10-CM | POA: Diagnosis not present

## 2022-11-26 DIAGNOSIS — Z0000 Encounter for general adult medical examination without abnormal findings: Secondary | ICD-10-CM | POA: Diagnosis not present

## 2022-11-26 DIAGNOSIS — R899 Unspecified abnormal finding in specimens from other organs, systems and tissues: Secondary | ICD-10-CM | POA: Diagnosis not present

## 2022-11-26 DIAGNOSIS — I2510 Atherosclerotic heart disease of native coronary artery without angina pectoris: Secondary | ICD-10-CM | POA: Diagnosis not present

## 2024-02-03 NOTE — Progress Notes (Signed)
 CARDIOLOGY CONSULT NOTE       Patient ID: Tyler Castro MRN: [id number] DOB/AGE: 1956/08/13 68 y.o.  Referring Physician: Judithann Sheen Primary Physician: [facility] Primary Cardiologist: New Reason for Consultation: CAD   HPI:  68 y.o. referred by Dr Judithann Sheen for CAD He had cath in 2014 by Dr Katrinka Blazing with no obstructive dx but had "calcified coronary arteries"History of statin intolerance History of Parkinson's. LDL runs in the 130 range Primary had discussed Repatha in past but does not look like it was ever started He has had more progressive exercise intolerance with dyspnea and fatigue but no chest pain   Review of cath showed only fluoroscopic calcium in LAD. Eccentric < 30% distal RCA stenosis. Mention ? False positive nuclear study  Had myovue 01/18/22 Normal pefusion EF 57%   He use to see Lieutenant Diego with [facility] neuro His mom had Parkinson's He was diagnosed clinically 10 years ago no need for DAT. He walks his dog Beau daily. After about 20 minutes feels weak , dizzy and as if his HR can't keep up Feels better standing and just slowing down. Similarly at [facility] after 20 minutes can get this weak spells. No chest pain  He is a retire Engineer, materials for [employer] Has a daughter who is Clinical research associate in [place] and son working with [employer] crew in [place].   ROS All other systems reviewed and negative except as noted above  Past Medical History:  Diagnosis Date   Arthritis    shoulder   BPH (benign prostatic hyperplasia)    History of hepatitis 2005   Seasonal allergies     Family History  Problem Relation Age of Onset   Hypertension Father    Heart disease Father        bypass age 77   Hyperlipidemia Father    CAD Father    Parkinsonism Mother    Stroke Paternal Grandmother    CAD Maternal Grandmother    Heart attack Maternal Grandmother     Social History   Socioeconomic History   Marital status: Married    Spouse name: Not on file   Number of  children: Not on file   Years of education: Not on file   Highest education level: Not on file  Occupational History   Occupation: Civil Service fast streamer  Tobacco Use   Smoking status: Never   Smokeless tobacco: Not on file  Substance and Sexual Activity   Alcohol use: Yes    Comment: beer/wine with dinner   Drug use: No   Sexual activity: Not on file  Other Topics Concern   Not on file  Social History Narrative   Not on file   Social Drivers of Health   Financial Resource Strain: Not on file  Food Insecurity: Not on file  Transportation Needs: Not on file  Physical Activity: Not on file  Stress: Not on file  Social Connections: Not on file  Intimate Partner Violence: Not on file    Past Surgical History:  Procedure Laterality Date   KNEE SURGERY Right 2002   LEFT HEART CATHETERIZATION WITH CORONARY ANGIOGRAM N/A 08/19/2013   Procedure: LEFT HEART CATHETERIZATION WITH CORONARY ANGIOGRAM;  Surgeon: Lesleigh Noe, MD;  Location: [facility] CATH LAB;  Service: Cardiovascular;  Laterality: N/A;   MENISCUS REPAIR Left 2002   PROSTATECTOMY  2011   SHOULDER SURGERY Right 1980      Current Outpatient Medications:    acetaminophen (TYLENOL) 325 MG tablet, Take 325  mg by mouth every 4 (four) hours as needed., Disp: , Rfl:    carbidopa-levodopa (SINEMET IR) 25-100 MG per tablet, Take 3 tablets by mouth 4 (four) times daily -  with meals and at bedtime., Disp: , Rfl: 0   clonazePAM (KLONOPIN) 0.5 MG tablet, Take 0.5 mg by mouth at bedtime., Disp: , Rfl:    MAGNESIUM PO, Take by mouth. TAKE ONE PILL DAILY, Disp: , Rfl:    aspirin EC 81 MG tablet, Take 81 mg by mouth daily. (Patient not taking: Reported on 02/11/2024), Disp: , Rfl:    cetirizine (ZYRTEC) 10 MG tablet, Take 10 mg by mouth daily as needed for allergies. (Patient not taking: Reported on 02/11/2024), Disp: , Rfl:    ibuprofen (ADVIL,MOTRIN) 200 MG tablet, Take 400-800 mg by mouth every 6 (six) hours as needed for pain. (Patient not  taking: Reported on 02/11/2024), Disp: , Rfl:    rosuvastatin (CRESTOR) 5 MG tablet, Take 1 tablet (5 mg total) by mouth daily. (Patient not taking: Reported on 02/11/2024), Disp: 30 tablet, Rfl: 5    Physical Exam: Blood pressure 116/78, pulse (!) 48, height 6\' 3"  (1.905 m), weight 220 lb 12.8 oz (100.2 kg), SpO2 99%.  Standing BP systolic drops to 90 mmHg but patient not symptomatic   Affect appropriate Healthy:  appears stated age HEENT: normal Neck supple with no adenopathy JVP normal no bruits no thyromegaly Lungs clear with no wheezing and good diaphragmatic motion Heart:  S1/S2 no murmur, no rub, gallop or click PMI normal Abdomen: benighn, BS positve, no tenderness, no AAA no bruit.  No HSM or HJR Distal pulses intact with no bruits No edema Neuro non-focal  fairly classic parkinson's with masked facies, shuffling gait and peripheral rigidity Slow speech Skin warm and dry No muscular weakness   Labs:  No results found for: "WBC", "HGB", "HCT", "MCV", "PLT" No results for input(s): "NA", "K", "CL", "CO2", "BUN", "CREATININE", "CALCIUM", "PROT", "BILITOT", "ALKPHOS", "ALT", "AST", "GLUCOSE" in the last 168 hours.  Invalid input(s): "LABALBU" No results found for: "CKTOTAL", "CKMB", "CKMBINDEX", "TROPONINI"  Lab Results  Component Value Date   CHOL 176 12/04/2015   CHOL 186 06/05/2015   CHOL 188 03/27/2015   Lab Results  Component Value Date   HDL 67 12/04/2015   HDL 70 06/05/2015   HDL 66 03/27/2015   Lab Results  Component Value Date   LDLCALC 98 12/04/2015   LDLCALC 106 (H) 06/05/2015   LDLCALC 111 (H) 03/27/2015   Lab Results  Component Value Date   TRIG 54 12/04/2015   TRIG 52 06/05/2015   TRIG 53 03/27/2015   Lab Results  Component Value Date   CHOLHDL 2.6 12/04/2015   No results found for: "LDLDIRECT"    Radiology: No results found.  EKG: SB rate 48 no AV block   ASSESSMENT AND PLAN:   CAD: non obstructive on cath 2014 Normal myovue 2023  No chest pain I don't think his symptoms are related to ischemia HLD:  intolerant to statins. Discussed repatha/PSK9 and nexlizet Given calcified LAD and LDL off Rx in 130 range really should be on RX  Will work through other issues first and readdress  Parkinson's  continue sinemet fairly severe clinically Needs a new neurologist will refer to Dr Tat  Autonomic:  dysfunction some postural hypotension. On Sinemet He does not seem symptomatic enough to start midodrine  Bradycardia:  previous long distance runner always low HR.  ECG SB rate 48 at rest no AV  block Not on any AV nodal drugs Concern for chronotropic incompetence as cause of his symptoms Will get 30 day monitor  and showed him Kardia mobile device for phone. Will try to have him walk a bit on treadmill using [place] or Modified Bruce to see what his max HR achievable is and see if he has vasodepressor response.  Also will need TTE to r/o structural heart dx   Echo 30 day monitor  ETT Refer to Dr Tat for Parkinson's   F/U in 4-6 weeks after tests  Signed: Charlton Haws 02/11/2024, 4:19 PM

## 2024-02-11 ENCOUNTER — Encounter: Payer: Self-pay | Admitting: Cardiovascular Disease

## 2024-02-11 ENCOUNTER — Ambulatory Visit: Payer: Medicare Other | Attending: Cardiovascular Disease | Admitting: Cardiovascular Disease

## 2024-02-11 VITALS — BP 116/78 | HR 48 | Ht 75.0 in | Wt 220.8 lb

## 2024-02-11 DIAGNOSIS — G20A1 Parkinson's disease without dyskinesia, without mention of fluctuations: Secondary | ICD-10-CM

## 2024-02-11 DIAGNOSIS — R55 Syncope and collapse: Secondary | ICD-10-CM

## 2024-02-11 DIAGNOSIS — I2510 Atherosclerotic heart disease of native coronary artery without angina pectoris: Secondary | ICD-10-CM

## 2024-02-11 DIAGNOSIS — R002 Palpitations: Secondary | ICD-10-CM

## 2024-02-11 DIAGNOSIS — R001 Bradycardia, unspecified: Secondary | ICD-10-CM | POA: Diagnosis present

## 2024-02-11 NOTE — Addendum Note (Signed)
 Addended by: Virl Axe, Idonia Zollinger L on: 02/11/2024 04:26 PM   Modules accepted: Orders

## 2024-02-11 NOTE — Patient Instructions (Addendum)
 Medication Instructions:  Your physician recommends that you continue on your current medications as directed. Please refer to the Current Medication list given to you today.  *If you need a refill on your cardiac medications before your next appointment, please call your pharmacy*  Lab Work: If you have labs (blood work) drawn today and your tests are completely normal, you will receive your results only by: MyChart Message (if you have MyChart) OR A paper copy in the mail If you have any lab test that is abnormal or we need to change your treatment, we will call you to review the results.  Testing/Procedures: Your physician has requested that you have an echocardiogram. Echocardiography is a painless test that uses sound waves to create images of your heart. It provides your doctor with information about the size and shape of your heart and how well your heart's chambers and valves are working. This procedure takes approximately one hour. There are no restrictions for this procedure. Please do NOT wear cologne, perfume, aftershave, or lotions (deodorant is allowed). Please arrive 15 minutes prior to your appointment time.  Please note: We ask at that you not bring children with you during ultrasound (echo/ vascular) testing. Due to room size and safety concerns, children are not allowed in the ultrasound rooms during exams. Our front office staff cannot provide observation of children in our lobby area while testing is being conducted. An adult accompanying a patient to their appointment will only be allowed in the ultrasound room at the discretion of the ultrasound technician under special circumstances. We apologize for any inconvenience. Your physician has requested that you have an exercise tolerance test. For further information please visit [email or web address] Please also follow instruction sheet, as given.  Your physician has recommended that you wear an event monitor. Event monitors are  medical devices that record the heart's electrical activity. Doctors most often [place] these monitors to diagnose arrhythmias. Arrhythmias are problems with the speed or rhythm of the heartbeat. The monitor is a small, portable device. You can wear one while you do your normal daily activities. This is usually used to diagnose what is causing palpitations/syncope (passing out).  Follow-Up: At [facility], you and your health needs are our priority.  As part of our continuing mission to provide you with exceptional heart care, we have created designated Provider Care Teams.  These Care Teams include your primary Cardiologist (physician) and Advanced Practice Providers (APPs -  Physician Assistants and Nurse Practitioners) who all work together to provide you with the care you need, when you need it.  We recommend signing up for the patient portal called "MyChart".  Sign up information is provided on this After Visit Summary.  MyChart is used to connect with patients for Virtual Visits (Telemedicine).  Patients are able to view lab/test results, encounter notes, upcoming appointments, etc.  Non-urgent messages can be sent to your provider as well.   To learn more about what you can do with MyChart, go to [email or web address].    Your next appointment:   March 16, 2024  Provider:   Dr. Eden Emms    Other Instructions                       Patient Instructions for Stress Test  Medication instructions:   You do not need to hold any medications.  Do not eat, drink or use tobacco products four hours prior to the test.  Water is ok.  3.  Dress  prepared to exercise in a comfortable, two piece clothing outfit and walking shoes.  4.  Bring any current prescription medications with you the day of the test.  5.  Notify the office 24 hours in advance if you cannot keep this appointment.  6.  If you have any questions, please call [phone].       1st Floor: - Lobby - Registration  -  Pharmacy  - Lab - Cafe  2nd Floor: - PV Lab - Diagnostic Testing (echo, CT, nuclear med)  3rd Floor: - Vacant  4th Floor: - TCTS (cardiothoracic surgery) - AFib Clinic - Structural Heart Clinic - Vascular Surgery  - Vascular Ultrasound  5th Floor: - HeartCare Cardiology (general and EP) - Clinical Pharmacy for coumadin, hypertension, lipid, weight-loss medications, and med management appointments    Valet parking services will be available as well.

## 2024-02-12 ENCOUNTER — Encounter: Payer: Self-pay | Admitting: Neurology

## 2024-02-18 ENCOUNTER — Encounter: Payer: Self-pay | Admitting: Cardiovascular Disease

## 2024-02-19 NOTE — Progress Notes (Signed)
 Assessment/Plan:   1.  Parkinsons disease, diagnosed 2015, with symptoms several years prior  -Given the number of years that patient has had this diagnosis, he looks quite well.  He and I discussed this.  -Patient has motor fluctuations with off time, but it really is quite well-managed with medication.    -He will continue with carbidopa/levodopa 25/100, 1.5 tablets 5 times per day (7 AM/10 AM/1 PM/5 PM/10 PM)  -He will continue carbidopa/levodopa 25/100 CR, 1 tablet 5 times per day at the same time as the IR  -Patient has tried and failed multiple medications  -Patient is not currently interested in DBS therapy.  -We discussed Vyalev, which is foscarbidopa/foslevodopa pump that is newly FDA approved.  We discussed that it is for motor fluctuations in adults with advanced Parkinson's disease.  We discussed that this likely will not be on Medicare formulary until the latter half of 2025.  We discussed risks and benefits of this drug.   -We discussed inapgo, which was just recently FDA approved pump.  -We discussed genetic testing for Parkinsons disease through Parkinson's foundation.    -We discussed the importance of treating his mental and physical health.  I think he is doing great with his physical health and his exercise, but encouraged him to remember his mental health as well, especially now that he has retired.  We did discuss counseling.  We discussed that 75% of patients with Parkinson's will need medication across their lifetime for mood.  2.  Bradycardia  -Patient under the management of cardiology and currently wearing a monitor for near syncope. Subjective:   Tyler Castro was seen today in the movement disorders clinic for neurologic consultation at the request of Wendall Stade, MD.  Pt with wife who supplements hx.   The consultation is for the evaluation of Parkinsons disease.  Patient has been under the care of Dr. Arlana Pouch for many years.  I have reviewed numerous records  all the way back to 2015.  Patient was diagnosed with Parkinsons in 2015, but he experienced tremor in his right hand for 3 to 4 years prior to that.  Levodopa was initiated in the fall 2015, and he did improve initially with that medication and stayed on levodopa monotherapy until mid 2021 when entacapone was added.  He had some stomach cramping and constipation with that and ended up stopping it.  He trialed very low-dose pramipexole (0.25 mg 3 times per day) the following visit and had the same result.  Selegiline also had the same results.  There were a few medications that were prescribed but not taken such as opicapone (worried about side effects) and rotigotine (costly).  Inbrija was trialed but produced cough.  Patient was last seen by Dr. Arlana Pouch November 18, 2023.  Current movement disorder medications: Carbidopa/levodopa 25/100, 1.5 tablets 5 times per day (7am/10am/1pm/5pm/10pm) Carbidopa/levodopa 25/100 CR, 1 tablet 5 times per day Clonazepam 0.5 mg, 1.5 tablets at bedtime   Specific Symptoms:  Tremor: some in the R hand when the medication is off. Wife used to note it in RLE but better now Family hx of similar:  Yes.   mother was diagnosed with Parkinson's in her 77s Voice: no change per pt except for "little scratchy" Sleep: trouble staying asleep even with the klonopin  Vivid Dreams:  Yes.    Acting out dreams:  some but no falls OOB Wet Pillows: No. Postural symptoms:  "its not what it used to be but its not bad";  walks 3 days per week and exercises on the exercise bike 30 min few days/week  Falls?  No. Bradykinesia symptoms: may shuffle if meds are off; trouble with running.   Loss of smell:  Yes.   Loss of taste:  Yes.   Urinary Incontinence:  No. Difficulty Swallowing:  No. Handwriting, micrographia: little smaller Trouble with ADL's:  No.  Trouble buttoning clothing: No. Depression:  "I'm not happy.  This is a lot to deal with." Memory changes:  trouble with names/words;  needs to write stuff down to remember.   Hallucinations:  No.  visual distortions: "I've always had this" N/V:  No. Lightheaded:  if stands up too fast - wearing holter monitor currently for near syncope  Syncope: No. Diplopia:  No. Dyskinesia:  occasionally with medication "off"    PREVIOUS MEDICATIONS: carbidopa/levodopa; entacapone (stomach cramps); pramipexole (stomach cramps at 0.25 mg 3 times per day); rotigotine was given but not taken due to cost; opicapone (prescribed but not taken as patient was worried about side effects); selegiline (stomach cramps); amantadine; Inbrija (cough)  ALLERGIES:   Allergies  Allergen Reactions   Sulfa Antibiotics     Liver function abnormal    CURRENT MEDICATIONS:  Current Meds  Medication Sig   acetaminophen (TYLENOL) 325 MG tablet Take 325 mg by mouth every 4 (four) hours as needed.   carbidopa-levodopa (SINEMET IR) 25-100 MG per tablet Take 3 tablets by mouth 4 (four) times daily -  with meals and at bedtime.   Carbidopa-Levodopa ER (SINEMET CR) 25-100 MG tablet controlled release Take 1 tablet by mouth 5 (five) times daily.   clonazePAM (KLONOPIN) 0.5 MG tablet Take 0.5 mg by mouth at bedtime.     Objective:   VITALS:   Vitals:   02/23/24 1437  BP: 132/60  Pulse: (!) 49  SpO2: 99%  Weight: 221 lb 9.6 oz (100.5 kg)  Height: 6' 3.5" (1.918 m)    GEN:  The patient appears stated age and is in NAD. HEENT:  Normocephalic, atraumatic.  The mucous membranes are moist. The superficial temporal arteries are without ropiness or tenderness. CV:  Huston Foley.  regular Lungs:  CTAB Neck/HEME:  There are no carotid bruits bilaterally.  Neurological examination:  Orientation: The patient is alert and oriented x3.  Cranial nerves: There is good facial symmetry. Extraocular muscles are intact. The visual fields are full to confrontational testing. The speech is fluent and clear. Soft palate rises symmetrically and there is no tongue deviation.  Hearing is intact to conversational tone. Sensation: Sensation is intact to light and pinprick throughout (facial, trunk, extremities). Vibration is intact at the bilateral ankle. There is no extinction with double simultaneous stimulation. There is no sensory dermatomal level identified. Motor: Strength is 5/5 in the bilateral upper and lower extremities.   Shoulder shrug is equal and symmetric.  There is no pronator drift. Deep tendon reflexes: Deep tendon reflexes are 2/4 at the bilateral biceps, triceps, brachioradialis, patella and achilles. Plantar responses are downgoing bilaterally.  Movement examination: Tone: There is mild increased tone in the RUE.  Tone elsewhere is nl Abnormal movements: mild tremor is felt in the R hand with distraction only; there is mild R hand dystonia; rare head dyskinesia, only when distracted Coordination:  There is no significant decremation with RAM's, with any form of RAMS, including alternating supination and pronation of the forearm, hand opening and closing, finger taps, heel taps and toe taps.  Gait and Station: The patient has no difficulty arising out  of a deep-seated chair without the use of the hands. The patient's stride length is good.  The patient has a neg pull test.        Total time spent on today's visit was 75 minutes, including both face-to-face time and nonface-to-face time.  Time included that spent on review of records (prior notes available to me/labs/imaging if pertinent), discussing treatment and goals, answering patient's questions and coordinating care.  Cc:  Pa, Theatre stage manager And [employer]

## 2024-02-23 ENCOUNTER — Encounter: Payer: Self-pay | Admitting: Neurology

## 2024-02-23 ENCOUNTER — Ambulatory Visit (INDEPENDENT_AMBULATORY_CARE_PROVIDER_SITE_OTHER): Payer: Medicare Other | Admitting: Neurology

## 2024-02-23 VITALS — BP 132/60 | HR 49 | Ht 75.5 in | Wt 221.6 lb

## 2024-02-23 DIAGNOSIS — R55 Syncope and collapse: Secondary | ICD-10-CM

## 2024-02-23 DIAGNOSIS — G20B2 Parkinson's disease with dyskinesia, with fluctuations: Secondary | ICD-10-CM | POA: Diagnosis not present

## 2024-02-23 DIAGNOSIS — R001 Bradycardia, unspecified: Secondary | ICD-10-CM | POA: Diagnosis not present

## 2024-02-23 NOTE — Patient Instructions (Signed)
 Local and Online Resources for Power over Parkinson's Group?  March 2025 ?  LOCAL [place] PARKINSON'S GROUPS??  Power over Parkinson's Group:???  Upcoming Power over [employer] Meetings/Care Partner Support:? 2nd Wednesdays of the month at 2 pm:  March 12th, April 9th Contact Tyler Castro at [email or web address] if interested in participating in this group?  ?  LOCAL EVENTS AND NEW OFFERINGS?  Dance Project Spring 2025:  January 14-May 20, Tuesdays 10-11 am.  All details on website: [email or web address] ACT FITNESS Chair Yoga classes "Train and Gain", Fridays 10 am, ACT [employer].  Contact Tyler Castro at [phone].   PWR! Moves [place] class!  Wednesdays at 10 am.  Please contact Tyler Castro, PT at [email or web address] if interested. Health visitor Classes offering at [employer]!? Tuesdays (Chair Yoga)  and Wednesdays (PWR! Moves)  1:00 pm.?? Contact Tyler Castro [phone] or [email or web address] Drumming for Parkinson's will be held on 2nd and 4th Mondays at 11:00 am.?? Located at the [place] [facility] the [place] ([address] [place].) ? Contact Tyler Castro at [email or web address] or [phone]?  [employer] Parkinson's Tai Chi Class, Mondays at 11 am.  Call [phone] for details  [facility] at [facility]- [address], [place] Wednesdays- 4:00 - 5:00 PM - specifically for Parkinson's Disease.  Free!  Contact Tyler Castro, [place] - [phone] (clinic) or  [phone] (cell) or by email: [email or web address]   ?ONLINE EDUCATION AND SUPPORT?  Parkinson Foundation:? www.parkinson.org?  PD Health at Home continues:? Mindfulness Mondays, Wellness Wednesdays, Fitness Fridays??  Upcoming Education:??  Hospital Journeys:  Lived Experiences and Set designer.   Wednesday, March 19th, 1-2 pm Harmony in Motion:  Music  and Movement for Connection.  Wednesday, April 2nd, 1-2 pm Expert Briefing:    Nourishing Wellness-Nutrition for [employer].  Wednesday, March 12th, 1-2 pm. The Latest Advances in Parkinson's Research and Treatment.  Wednesday, April 9th, 1-2 pm Register for virtual education and Photographer (webinars) at [email or web address].gl  Please check out their website to sign up for emails and see their full online offerings??  ?  [employer] Foundation:? www.michaeljfox.org??  Third Thursday Webinars:? On the third Thursday of every month at 12 p.m. ET, join our free live webinars to learn about various aspects of living with Parkinson's disease and our work to speed medical breakthroughs.?  Upcoming Webinar:? Sniff Test:  How Surprising New Learnings about Smell Loss and Parkinson's.  Thursday, March 20th at 12 noon.  Check out additional information on their website to see their full online offerings?  ?  [employer]:? www.[email or web address].org?  Upcoming Webinar:   Meeting your Mental Health Needs.  Wednesday, March 12th at 12 noon Series:? Living with Parkinson's Meetup.?? Third Thursdays each month, 3 pm?  Care Partner Monthly Meetup.? With Tyler Castro.? First Tuesday of each month, 2 pm?  Check out additional information to Live Well Today on their website?  ?  Parkinson and Movement Disorders (PMD) Alliance:? www.pmdalliance.org?  NeuroLife Online:? Online Education Events?  Sign up for emails, which are sent weekly to give you updates on programming and online offerings?  ?  Parkinson's Association of the [place]:? www.parkinsonassociation.org?  Information on online support groups, education events, and online exercises including Yoga, Parkinson's exercises and more-LOTS of information on links to PD resources and online events?  Virtual Support Group through [employer] of the [place]-First Wednesday of  each month at 2 pm   MOVEMENT AND EXERCISE OPPORTUNITIES?  PWR! Moves [employer] class  continues!  Wednesdays at 10 am.  Please contact Tyler Castro, PT at [email or web address] if interested. Parkinson's Exercise Class offerings at [employer]. Tuesdays (Chair yoga) and Wednesdays (PWR! Moves)  1:00 pm.?  Contact Tyler Castro [phone] or [email or web address]  Parkinson's Wellness Recovery (PWR! Moves)? www.pwr4life.org?  Info on the PWR! Virtual Experience:? You will have access to our expertise?through self-assessment, guided plans that start with the PD-specific fundamentals, educational content, tips, Q&A with an expert, and a growing Engineering geologist of PD-specific pre-recorded and live exercise classes of varying types and intensity - both physical and cognitive! If that is not enough, we offer 1:1 wellness consultations (in-person or virtual) to personalize your PWR! Dance movement psychotherapist.??  Parkinson [employer] Fridays:??  As part of the PD Health @ Home program, this free video series focuses each week on one aspect of fitness designed to support people living with Parkinson's.? These weekly videos highlight the Parkinson Foundation fitness guidelines for people with Parkinson's disease.?  [email or web address].com.[email or web address]?  Dance for PD website is offering free, live-stream classes throughout the week, as well as links to [employer] of classes:? [email or web address]://[email or web address]/?  Virtual dance and Pilates for Parkinson's classes: Click on the Community Tab> Parkinson's Movement Initiative Tab.? To register for classes and for more information, visit [email or web address].co.[email or web address] and click the "community" tab.??  [employer] Parkinson's Cycling Classes??  [facility]:? Thursdays @ Noon-Live classes at [employer] ([employer] at [email or web address]?or [phone])?  [facility]: Classes Tuesday, Wednesday and Thursday  (contact Tyler Castro at [email or web address] ?or [phone])?  [employer]?  Varied levels of classes are offered Tuesdays and Thursdays at [facility].??  Stretching with Tyler Castro weekly class is also offered for people with Parkinson's?  To observe a class or for more information, call [phone] or email Tyler Castro at [email or web address]?    ADDITIONAL SUPPORT AND RESOURCES?  [employer]:  Chiropractor:? www.well-springsolutions.org/caregiver-education/caregiver-support-group.? You may also contact Tyler Castro at [facility] -spring.org or [phone].????  Well-Spring Navigator:? Just1Navigator program, a?free service to help individuals and families through the journey of determining care for older adults.? The "Navigator" is a Child psychotherapist, Tyler Castro, who will speak with a prospective client and/or loved ones to provide an assessment of the situation and a set of recommendations for a personalized care plan -- all free of charge, and whether?[facility] [employer] offers the needed service or not. If the need is not a service we provide, we are well-connected with reputable programs in town that we can refer you to.? www.well-springsolutions.org or to speak with the Navigator, call [phone].?

## 2024-03-05 ENCOUNTER — Ambulatory Visit (HOSPITAL_COMMUNITY): Payer: Medicare Other | Attending: Cardiology

## 2024-03-05 DIAGNOSIS — R002 Palpitations: Secondary | ICD-10-CM | POA: Insufficient documentation

## 2024-03-05 DIAGNOSIS — I2510 Atherosclerotic heart disease of native coronary artery without angina pectoris: Secondary | ICD-10-CM | POA: Diagnosis present

## 2024-03-05 DIAGNOSIS — R079 Chest pain, unspecified: Secondary | ICD-10-CM

## 2024-03-05 LAB — ECHOCARDIOGRAM COMPLETE
Area-P 1/2: 1.52 cm2
S' Lateral: 2.8 cm

## 2024-03-05 NOTE — Progress Notes (Signed)
 CARDIOLOGY CONSULT NOTE       Patient ID: Tyler Castro MRN: [id number] DOB/AGE: [birth year] 68 y.o.  Referring Physician: Judithann Sheen Primary Physician: [facility] Primary Cardiologist: Eden Emms Reason for Consultation: CAD   HPI:  68 y.o. referred by Dr Judithann Sheen for CAD First seen by me 02/11/24 He had cath in 2014 by Dr Katrinka Blazing with no obstructive dx but had "calcified coronary arteries"History of statin intolerance History of Parkinson's. LDL runs in the 130 range Primary had discussed Repatha in past but does not look like it was ever started He has had more progressive exercise intolerance with dyspnea and fatigue but no chest pain   Review of cath showed only fluoroscopic calcium in LAD. Eccentric < 30% distal RCA stenosis. Mention ? False positive nuclear study  Had myovue 01/18/22 Normal pefusion EF 57%   He use to see Lieutenant Diego with [facility] neuro His mom had Parkinson's He was diagnosed clinically 10 years ago no need for DAT. He walks his dog Beau daily. After about 20 minutes feels weak , dizzy and as if his HR can't keep up Feels better standing and just slowing down. Similarly at [facility] after 20 minutes can get this weak spells. No chest pain  Wearing his event monitor Does not feel like he could walk on treadmill to test chronotropic competence without legs giving out TTE 03/05/24 reviewed EF normal mild MR aorta 4.0 cm moderate LAE   He is a retire patent Clinical research associate for [employer] Has a daughter who is Clinical research associate in [place] and son working with [employer] crew in [place].   I referred him to Dr Tat for continuity of care for his Parkinson's She saw him on 02/23/24 she kept him on same meds and discussed multiple options for future  Discussed lipid clinic referral to start Repatha  ROS All other systems reviewed and negative except as noted above  Past Medical History:  Diagnosis Date   Arthritis    shoulder   BPH (benign prostatic hyperplasia)    History of  hepatitis 12/17/2003   chemical sulfa   Seasonal allergies     Family History  Problem Relation Age of Onset   Parkinsonism Mother    Parkinson's disease Mother    Hypertension Father    Heart disease Father        bypass age 76   Hyperlipidemia Father    CAD Father    Parkinson's disease Brother    CAD Maternal Grandmother    Heart attack Maternal Grandmother    Stroke Paternal Grandmother     Social History   Socioeconomic History   Marital status: Married    Spouse name: Not on file   Number of children: Not on file   Years of education: Not on file   Highest education level: Not on file  Occupational History   Occupation: Civil Service fast streamer    Comment: retired  Tobacco Use   Smoking status: Never   Smokeless tobacco: Not on file  Substance and Sexual Activity   Alcohol use: Yes    Comment: beer/wine with dinner   Drug use: No   Sexual activity: Not on file  Other Topics Concern   Not on file  Social History Narrative   Right handed    Retired    Chief Executive Officer [employer] investment banker Strain: Not on file  Food Insecurity: Not on file  Transportation Needs: Not on file  Physical Activity: Not on file  Stress:  Not on file  Social Connections: Not on file  Intimate Partner Violence: Not on file    Past Surgical History:  Procedure Laterality Date   KNEE SURGERY Right 2002   LEFT HEART CATHETERIZATION WITH CORONARY ANGIOGRAM N/A 08/19/2013   Procedure: LEFT HEART CATHETERIZATION WITH CORONARY ANGIOGRAM;  Surgeon: Lesleigh Noe, MD;  Location: [facility] CATH LAB;  Service: Cardiovascular;  Laterality: N/A;   MENISCUS REPAIR Left 2002   PROSTATECTOMY  2011   SHOULDER SURGERY Right 1980      Current Outpatient Medications:    acetaminophen (TYLENOL) 325 MG tablet, Take 325 mg by mouth every 4 (four) hours as needed., Disp: , Rfl:    carbidopa-levodopa (SINEMET IR) 25-100 MG per tablet, Take by mouth. 1 and 1/2 tabs  4 times per day, Disp: , Rfl: 0    Carbidopa-Levodopa ER (SINEMET CR) 25-100 MG tablet controlled release, Take 1 tablet by mouth 5 (five) times daily., Disp: , Rfl:    clonazePAM (KLONOPIN) 0.5 MG tablet, Take 0.5 mg by mouth at bedtime., Disp: , Rfl:    MAGNESIUM PO, Take by mouth. TAKE ONE PILL DAILY, Disp: , Rfl:     Physical Exam: Blood pressure 110/70, pulse (!) 53, height 6' 3.5" (1.918 m), weight 223 lb 9.6 oz (101.4 kg), SpO2 98%.  Standing BP systolic drops to 90 mmHg but patient not symptomatic   Affect appropriate Healthy:  appears stated age HEENT: normal Neck supple with no adenopathy JVP normal no bruits no thyromegaly Lungs clear with no wheezing and good diaphragmatic motion Heart:  S1/S2 no murmur, no rub, gallop or click PMI normal Abdomen: benighn, BS positve, no tenderness, no AAA no bruit.  No HSM or HJR Distal pulses intact with no bruits No edema Neuro non-focal  fairly classic parkinson's with masked facies, shuffling gait and peripheral rigidity Slow speech Skin warm and dry No muscular weakness   Labs:  No results found for: "WBC", "HGB", "HCT", "MCV", "PLT" No results for input(s): "NA", "K", "CL", "CO2", "BUN", "CREATININE", "CALCIUM", "PROT", "BILITOT", "ALKPHOS", "ALT", "AST", "GLUCOSE" in the last 168 hours.  Invalid input(s): "LABALBU" No results found for: "CKTOTAL", "CKMB", "CKMBINDEX", "TROPONINI"  Lab Results  Component Value Date   CHOL 176 12/04/2015   CHOL 186 06/05/2015   CHOL 188 03/27/2015   Lab Results  Component Value Date   HDL 67 12/04/2015   HDL 70 06/05/2015   HDL 66 03/27/2015   Lab Results  Component Value Date   LDLCALC 98 12/04/2015   LDLCALC 106 (H) 06/05/2015   LDLCALC 111 (H) 03/27/2015   Lab Results  Component Value Date   TRIG 54 12/04/2015   TRIG 52 06/05/2015   TRIG 53 03/27/2015   Lab Results  Component Value Date   CHOLHDL 2.6 12/04/2015   No results found for: "LDLDIRECT"    Radiology: ECHOCARDIOGRAM COMPLETE Result Date:  03/05/2024    ECHOCARDIOGRAM REPORT   Patient Name:   Tyler Castro Date of Exam: 03/05/2024 Medical Rec #:  [id number]      Height:       75.5 in Accession #:    [id number]     Weight:       221.6 lb Date of Birth:  [birth year]      BSA:          2.303 m Patient Age:    67 years       BP:           132/60 mmHg Patient  Gender: M              HR:           45 bpm. Exam Location:  [address] Procedure: 2D Echo, 3D Echo, Cardiac Doppler and Color Doppler (Both Spectral            and Color Flow Doppler were utilized during procedure). Indications:    R07.9 Chest Pain  History:        Patient has prior history of Echocardiogram examinations, most                 recent 11/25/2013. CAD, Arrythmias:Bradycardia,                 Signs/Symptoms:Chest Pain; Risk Factors:Family History of                 Coronary Artery Disease. Parkinson's Disease, Near Syncope,                 Palpitations.  Sonographer:    Farrel Conners RDCS Referring Phys: Wendall Stade IMPRESSIONS  1. Left ventricular ejection fraction, by estimation, is 60 to 65%. Left ventricular ejection fraction by 3D volume is 66 %. The left ventricle has normal function. The left ventricle has no regional wall motion abnormalities. Left ventricular diastolic  parameters are consistent with Grade I diastolic dysfunction (impaired relaxation).  2. Right ventricular systolic function is normal. The right ventricular size is normal.  3. Left atrial size was mildly dilated.  4. Right atrial size was moderately dilated.  5. The mitral valve is normal in structure. Mild mitral valve regurgitation. No evidence of mitral stenosis.  6. The aortic valve is normal in structure. Aortic valve regurgitation is not visualized. No aortic stenosis is present.  7. The inferior vena cava is normal in size with greater than 50% respiratory variability, suggesting right atrial pressure of 3 mmHg. FINDINGS  Left Ventricle: Left ventricular ejection fraction, by estimation, is 60 to  65%. Left ventricular ejection fraction by 3D volume is 66 %. The left ventricle has normal function. The left ventricle has no regional wall motion abnormalities. The left ventricular internal cavity size was normal in size. There is no left ventricular hypertrophy. Left ventricular diastolic parameters are consistent with Grade I diastolic dysfunction (impaired relaxation). Right Ventricle: The right ventricular size is normal. No increase in right ventricular wall thickness. Right ventricular systolic function is normal. Left Atrium: Left atrial size was mildly dilated. Right Atrium: Right atrial size was moderately dilated. Pericardium: There is no evidence of pericardial effusion. Mitral Valve: The mitral valve is normal in structure. Mild mitral valve regurgitation. No evidence of mitral valve stenosis. Tricuspid Valve: The tricuspid valve is normal in structure. Tricuspid valve regurgitation is trivial. No evidence of tricuspid stenosis. Aortic Valve: The aortic valve is normal in structure. Aortic valve regurgitation is not visualized. No aortic stenosis is present. Pulmonic Valve: The pulmonic valve was normal in structure. Pulmonic valve regurgitation is mild. No evidence of pulmonic stenosis. Aorta: The aortic root is normal in size and structure. Venous: The inferior vena cava is normal in size with greater than 50% respiratory variability, suggesting right atrial pressure of 3 mmHg. IAS/Shunts: No atrial level shunt detected by color flow Doppler. Additional Comments: 3D was performed not requiring image post processing on an independent workstation and was normal.  LEFT VENTRICLE PLAX 2D LVIDd:         5.30 cm  Diastology LVIDs:         2.80 cm         LV e' medial:    6.42 cm/s LV PW:         1.10 cm         LV E/e' medial:  7.0 LV IVS:        0.80 cm         LV e' lateral:   7.72 cm/s LVOT diam:     2.50 cm         LV E/e' lateral: 5.8 LV SV:         109 LV SV Index:   47 LVOT Area:     4.91  cm        3D Volume EF                                LV 3D EF:    Left                                             ventricul                                             ar                                             ejection                                             fraction                                             by 3D                                             volume is                                             66 %.                                 3D Volume EF:                                3D EF:        66 %                                LV  EDV:       202 ml                                LV ESV:       68 ml                                LV SV:        134 ml RIGHT VENTRICLE RV Basal diam:  4.00 cm RV Mid diam:    2.60 cm RV S prime:     14.00 cm/s TAPSE (M-mode): 2.5 cm RVSP:           31.9 mmHg LEFT ATRIUM             Index        RIGHT ATRIUM           Index LA diam:        4.90 cm 2.13 cm/m   RA Pressure: 3.00 mmHg LA Vol (A2C):   81.3 ml 35.29 ml/m  RA Area:     29.30 cm LA Vol (A4C):   80.7 ml 35.03 ml/m  RA Volume:   110.00 ml 47.75 ml/m LA Biplane Vol: 83.4 ml 36.21 ml/m  AORTIC VALVE LVOT Vmax:   92.65 cm/s LVOT Vmean:  57.350 cm/s LVOT VTI:    0.222 m  AORTA Ao Root diam: 3.50 cm Ao Asc diam:  3.90 cm MITRAL VALVE               TRICUSPID VALVE MV Area (PHT)  cm         TR Peak grad:   28.9 mmHg MV Decel Time: 498 msec    TR Vmax:        269.00 cm/s MV E velocity: 45.05 cm/s  Estimated RAP:  3.00 mmHg MV A velocity: 45.70 cm/s  RVSP:           31.9 mmHg MV E/A ratio:  0.99                            SHUNTS                            Systemic VTI:  0.22 m                            Systemic Diam: 2.50 cm Donato Schultz MD Electronically signed by Donato Schultz MD Signature Date/Time: 03/05/2024/3:08:44 PM    Final     EKG: SB rate 48 no AV block   ASSESSMENT AND PLAN:   CAD: non obstructive on cath 2014 Normal myovue 2023 No chest pain I don't think his symptoms are related to  ischemia HLD:  intolerant to statins. Discussed repatha/PSK9 and nexlizet Given calcified LAD and LDL off Rx in 130 range really should be on RX  Refer to lipid clinic  Parkinson's  continue sinemet fairly severe clinically F/U Dr Tat Autonomic:  dysfunction some postural hypotension. On Sinemet He does not seem symptomatic enough to start midodrine  Bradycardia:  previous long distance runner always low HR.  ECG SB rate 48 at rest no AV block Not on any AV nodal drugs Concern for chronotropic incompetence as cause of his symptoms Does not want  ETT Wearing 30 day monitor will discuss after getting this result back  30 day monitor  F/U Tat for Parkinsons Refer to lipid clinic for PSK9  F/U in  6 months  Signed: Charlton Haws 03/16/2024, 9:09 AM

## 2024-03-16 ENCOUNTER — Ambulatory Visit: Payer: MEDICARE | Attending: Cardiovascular Disease | Admitting: Cardiovascular Disease

## 2024-03-16 ENCOUNTER — Encounter: Payer: Self-pay | Admitting: Cardiovascular Disease

## 2024-03-16 VITALS — BP 110/70 | HR 53 | Ht 75.5 in | Wt 223.6 lb

## 2024-03-16 DIAGNOSIS — R001 Bradycardia, unspecified: Secondary | ICD-10-CM | POA: Diagnosis not present

## 2024-03-16 DIAGNOSIS — I2510 Atherosclerotic heart disease of native coronary artery without angina pectoris: Secondary | ICD-10-CM | POA: Insufficient documentation

## 2024-03-16 DIAGNOSIS — E782 Mixed hyperlipidemia: Secondary | ICD-10-CM | POA: Insufficient documentation

## 2024-03-16 NOTE — Patient Instructions (Addendum)
 Medication Instructions:  Your physician recommends that you continue on your current medications as directed. Please refer to the Current Medication list given to you today.  *If you need a refill on your cardiac medications before your next appointment, please call your pharmacy*  Lab Work: If you have labs (blood work) drawn today and your tests are completely normal, you will receive your results only by: MyChart Message (if you have MyChart) OR A paper copy in the mail If you have any lab test that is abnormal or we need to change your treatment, we will call you to review the results.  Follow-Up: At [facility], you and your health needs are our priority.  As part of our continuing mission to provide you with exceptional heart care, our providers are all part of one team.  This team includes your primary Cardiologist (physician) and Advanced Practice Providers or APPs (Physician Assistants and Nurse Practitioners) who all work together to provide you with the care you need, when you need it.  Your next appointment:   6 month(s)  Provider:   Charlton Haws, MD    We recommend signing up for the patient portal called "MyChart".  Sign up information is provided on this After Visit Summary.  MyChart is used to connect with patients for Virtual Visits (Telemedicine).  Patients are able to view lab/test results, encounter notes, upcoming appointments, etc.  Non-urgent messages can be sent to your provider as well.   To learn more about what you can do with MyChart, go to [email or web address].   Other Instructions You have been referred to Lipid Clinic.       1st Floor: - Lobby - Registration  - Pharmacy  - Lab - Cafe  2nd Floor: - PV Lab - Diagnostic Testing (echo, CT, nuclear med)  3rd Floor: - Vacant  4th Floor: - TCTS (cardiothoracic surgery) - AFib Clinic - Structural Heart Clinic - Vascular Surgery  - Vascular Ultrasound  5th Floor: - HeartCare  Cardiology (general and EP) - Clinical Pharmacy for coumadin, hypertension, lipid, weight-loss medications, and med management appointments    Valet parking services will be available as well.

## 2024-03-23 ENCOUNTER — Ambulatory Visit: Attending: Cardiovascular Disease

## 2024-03-23 DIAGNOSIS — R002 Palpitations: Secondary | ICD-10-CM

## 2024-03-23 DIAGNOSIS — I2510 Atherosclerotic heart disease of native coronary artery without angina pectoris: Secondary | ICD-10-CM

## 2024-03-24 ENCOUNTER — Telehealth: Payer: Self-pay | Admitting: Cardiovascular Disease

## 2024-03-24 ENCOUNTER — Encounter: Payer: Self-pay | Admitting: Pharmacist

## 2024-03-24 NOTE — Telephone Encounter (Signed)
 Spoke with patient and he is aware of monitor results

## 2024-03-24 NOTE — Telephone Encounter (Signed)
 Patient was returning call. Please advise ?

## 2024-03-25 ENCOUNTER — Encounter: Payer: Self-pay | Admitting: Neurology

## 2024-03-26 MED ORDER — CLONAZEPAM 0.5 MG PO TABS: 0.7500 mg | ORAL_TABLET | Freq: Every day | ORAL | 1 refills | Status: DC

## 2024-04-22 ENCOUNTER — Telehealth: Payer: Self-pay | Admitting: Neurology

## 2024-04-22 NOTE — Telephone Encounter (Signed)
 1. Which medications need refilled? (List name and dosage, if known) clonazepam  - going out of town tomorrow and they won't fill it early. Needs our office to call pharmacy to approve it. He ordered it last week, and it said it would be ready today, but when he went to get it they said they couldn't do it.  2. Which pharmacy/location is medication to be sent to? (include street and city if local pharmacy) [facility]   He wants a call once we call [employer]

## 2024-04-23 NOTE — Telephone Encounter (Signed)
 Called [facility] and approved refill due to travel. Called patient but was only able to leave a message that it will be ready in one hour

## 2024-05-18 ENCOUNTER — Ambulatory Visit: Attending: Cardiology | Admitting: Pharmacist

## 2024-05-18 ENCOUNTER — Encounter: Payer: Self-pay | Admitting: Pharmacist

## 2024-05-18 DIAGNOSIS — E785 Hyperlipidemia, unspecified: Secondary | ICD-10-CM | POA: Insufficient documentation

## 2024-05-18 NOTE — Assessment & Plan Note (Signed)
 Assessment: LDL-C above goal of less than 70 Intolerant to rosuvastatin  and lovastatin Reviewed options for lowering LDL cholesterol, including ezetimibe, PCSK-9 inhibitors, bempedoic acid and inclisiran.  Discussed mechanisms of action, dosing, side effects and potential decreases in LDL cholesterol.  Also reviewed cost information and potential options for patient assistance.  Patient is active walks to ride stationary bike daily and lifts weights 2 days a week Tries to follow more of a Mediterranean diet  Plan: Will submit prior authorization for Repatha Repeat labs in 3 months

## 2024-05-18 NOTE — Progress Notes (Signed)
 Patient ID: Tyler Castro                 DOB: 25-May-1956                    MRN: [id number]      HPI: Melquisedec Journey is a 68 y.o. male patient referred to lipid clinic by Dr. Stann Earnest. PMH is significant for parkinson's, non-obstructive CAD on cath in 2014, calcified LAD, HLD and statin intolerance.   Patient presents today accompanied by his wife.  Reports he has tried rosuvastatin  and lovastatin.  Had stomach cramps to both of them.  Reviewed options for lowering LDL cholesterol, including ezetimibe, PCSK-9 inhibitors, bempedoic acid and inclisiran.  Discussed mechanisms of action, dosing, side effects and potential decreases in LDL cholesterol.  Also reviewed cost information and potential options for patient assistance.   Current Medications: None Intolerances: rosuvastatin , lovastatin (stomach cramps) Risk Factors: Nonobstructive CAD on cath LDL-C goal: <70 ApoB goal: <80  Diet:  Breakfast: granola or eggs Lunch: lefts overs Dinner: grilled meats, "Mediterranean diet" protein, vegetable and starch Drink: coffee, water  Exercise: walk or rides stationary bike daily, lifts weights 2 times per week  Family History:  Family History  Problem Relation Age of Onset   Parkinsonism Mother    Parkinson's disease Mother    Hypertension Father    Heart disease Father        bypass age 5   Hyperlipidemia Father    CAD Father    Parkinson's disease Brother    CAD Maternal Grandmother    Heart attack Maternal Grandmother    Stroke Paternal Grandmother     Social History: 1 glass of wine with dinner, no tobacco  Labs: Lipid Panel  12/03/23: TC 191, LDL-C 127, HLD 52 TG 66    Component Value Date/Time   CHOL 176 12/04/2015 0848   CHOL 186 06/05/2015 0753   TRIG 54 12/04/2015 0848   TRIG 52 06/05/2015 0753   HDL 67 12/04/2015 0848   HDL 70 06/05/2015 0753   CHOLHDL 2.6 12/04/2015 0848   VLDL 11 12/04/2015 0848   LDLCALC 98 12/04/2015 0848   LDLCALC 106 (H) 06/05/2015 0753     Past Medical History:  Diagnosis Date   Arthritis    shoulder   BPH (benign prostatic hyperplasia)    History of hepatitis 12/17/2003   chemical sulfa   Seasonal allergies     Current Outpatient Medications on File Prior to Visit  Medication Sig Dispense Refill   acetaminophen  (TYLENOL ) 325 MG tablet Take 325 mg by mouth every 4 (four) hours as needed.     carbidopa-levodopa (SINEMET IR) 25-100 MG per tablet Take by mouth. 1 and 1/2 tabs  4 times per day  0   Carbidopa-Levodopa ER (SINEMET CR) 25-100 MG tablet controlled release Take 1 tablet by mouth 5 (five) times daily.     clonazePAM  (KLONOPIN ) 0.5 MG tablet Take 1.5 tablets (0.75 mg total) by mouth at bedtime. 45 tablet 1   MAGNESIUM PO Take by mouth. TAKE ONE PILL DAILY     No current facility-administered medications on file prior to visit.    Allergies  Allergen Reactions   Sulfa Antibiotics     Liver function abnormal    Assessment/Plan:  1. Hyperlipidemia -  Hyperlipidemia Assessment: LDL-C above goal of less than 70 Intolerant to rosuvastatin  and lovastatin Reviewed options for lowering LDL cholesterol, including ezetimibe, PCSK-9 inhibitors, bempedoic acid and inclisiran.  Discussed mechanisms of action,  dosing, side effects and potential decreases in LDL cholesterol.  Also reviewed cost information and potential options for patient assistance.  Patient is active walks to ride stationary bike daily and lifts weights 2 days a week Tries to follow more of a Mediterranean diet  Plan: Will submit prior authorization for Repatha Repeat labs in 3 months    Thank you,  Claron Rosencrans D Ivey Nembhard, Pharm.Monika Annas, CPP [facility] [address], [place], [place] [address]  Phone: [phone]; Fax: [phone]

## 2024-05-18 NOTE — Patient Instructions (Signed)

## 2024-05-19 ENCOUNTER — Telehealth: Payer: Self-pay

## 2024-05-19 ENCOUNTER — Other Ambulatory Visit (HOSPITAL_COMMUNITY): Payer: Self-pay

## 2024-05-19 NOTE — Telephone Encounter (Signed)
 Pharmacy Patient Advocate Encounter   Received notification from Physician's Office that prior authorization for PRALUENT is required/requested.   Insurance verification completed.   The patient is insured through [facility] .   Per test claim: PA required; PA submitted to above mentioned insurance via CoverMyMeds Key/confirmation #/EOC [id number] Status is pending

## 2024-05-19 NOTE — Telephone Encounter (Signed)
 Pharmacy Patient Advocate Encounter   Received notification from Physician's Office that prior authorization for REPATHA  is required/requested.   Insurance verification completed.   The patient is insured through [facility] MEDICARE .   Per test claim:  PRALUENT is preferred by the insurance.  If suggested medication is appropriate, Please send in a new RX and discontinue this one. If not, please advise as to why it's not appropriate so that we may request a Prior Authorization. Please note, some preferred medications may still require a PA.  If the suggested medications have not been trialed and there are no contraindications to their use, the PA will not be submitted, as it will not be approved.

## 2024-05-19 NOTE — Telephone Encounter (Signed)
Scanned in media as well.

## 2024-05-19 NOTE — Telephone Encounter (Signed)
-----   Message from Benancio Bracket sent at 05/18/2024  3:54 PM EDT ----- Please do prior authorization for Repatha

## 2024-05-19 NOTE — Telephone Encounter (Signed)
 75 mg or 150 mg?

## 2024-05-19 NOTE — Telephone Encounter (Signed)
 PA request has been Submitted. New Encounter has been or will be created for follow up. For additional info see Pharmacy Prior Auth telephone encounter from 05/19/24.

## 2024-05-20 ENCOUNTER — Other Ambulatory Visit (HOSPITAL_COMMUNITY): Payer: Self-pay

## 2024-05-20 NOTE — Telephone Encounter (Signed)
 Pharmacy Patient Advocate Encounter  Received notification from [employer] that Prior Authorization for PRALUENT has been APPROVED from 04/15/24 to 12/15/98. Ran test claim, Copay is $445.59. This test claim was processed through [facility]- copay amounts may vary at other pharmacies due to pharmacy/plan contracts, or as the patient moves through the different stages of their insurance plan.

## 2024-05-21 ENCOUNTER — Encounter: Payer: Self-pay | Admitting: Neurology

## 2024-05-29 ENCOUNTER — Other Ambulatory Visit: Payer: Self-pay | Admitting: Neurology

## 2024-05-31 ENCOUNTER — Encounter: Payer: Self-pay | Admitting: Cardiovascular Disease

## 2024-06-01 ENCOUNTER — Encounter: Payer: Self-pay | Admitting: Cardiovascular Disease

## 2024-06-01 ENCOUNTER — Telehealth: Payer: Self-pay | Admitting: Cardiovascular Disease

## 2024-06-01 ENCOUNTER — Encounter: Payer: Self-pay | Admitting: Neurology

## 2024-06-01 NOTE — Telephone Encounter (Signed)
 Patient states he was returning your call. Please advise

## 2024-06-02 ENCOUNTER — Other Ambulatory Visit (HOSPITAL_COMMUNITY): Payer: Self-pay

## 2024-06-02 ENCOUNTER — Telehealth: Payer: Self-pay

## 2024-06-02 DIAGNOSIS — E785 Hyperlipidemia, unspecified: Secondary | ICD-10-CM

## 2024-06-02 DIAGNOSIS — I2510 Atherosclerotic heart disease of native coronary artery without angina pectoris: Secondary | ICD-10-CM

## 2024-06-02 NOTE — Telephone Encounter (Addendum)
 Patient Advocate Encounter   The patient was approved for a [employer] grant that will help cover the cost of PRALUENT  Total amount awarded, $2,500.  Effective: 05/03/24 - 05/02/25   APW:[id number] ERW:[id number] Hmnle:[id number] PI:[id number]  Ileana Lehmann, CPhT  Pharmacy Patient Advocate Specialist  Direct Number: [phone] Fax: [phone]

## 2024-06-02 NOTE — Telephone Encounter (Signed)
 Enrolled in [employer], please see separate encounter for details

## 2024-06-03 ENCOUNTER — Other Ambulatory Visit (HOSPITAL_COMMUNITY): Payer: Self-pay

## 2024-06-03 ENCOUNTER — Encounter: Payer: Self-pay | Admitting: Pharmacist

## 2024-06-03 MED ORDER — PRALUENT 150 MG/ML ~~LOC~~ SOAJ
150.0000 mg | SUBCUTANEOUS | 11 refills | Status: AC
  Filled 2024-06-03: qty 2, 28d supply, fill #0
  Filled 2024-07-02: qty 2, 28d supply, fill #1
  Filled 2024-08-04: qty 2, 28d supply, fill #2
  Filled 2024-09-01: qty 2, 28d supply, fill #3
  Filled 2024-09-28: qty 2, 28d supply, fill #4
  Filled 2024-10-22: qty 2, 28d supply, fill #5
  Filled 2024-11-29: qty 2, 28d supply, fill #6
  Filled 2025-01-16: qty 2, 28d supply, fill #7

## 2024-06-03 NOTE — Telephone Encounter (Signed)
 Done

## 2024-06-03 NOTE — Addendum Note (Signed)
 Addended by: Katielynn Horan D on: 06/03/2024 12:47 PM   Modules accepted: Orders

## 2024-06-04 NOTE — Telephone Encounter (Signed)
Communicated with pt via mychart.

## 2024-06-08 ENCOUNTER — Other Ambulatory Visit (HOSPITAL_COMMUNITY): Payer: Self-pay

## 2024-06-30 ENCOUNTER — Encounter: Payer: Self-pay | Admitting: Cardiovascular Disease

## 2024-07-02 ENCOUNTER — Other Ambulatory Visit (HOSPITAL_BASED_OUTPATIENT_CLINIC_OR_DEPARTMENT_OTHER): Payer: Self-pay

## 2024-08-04 ENCOUNTER — Other Ambulatory Visit (HOSPITAL_COMMUNITY): Payer: Self-pay

## 2024-08-04 ENCOUNTER — Encounter: Payer: Self-pay | Admitting: Cardiovascular Disease

## 2024-08-04 NOTE — Addendum Note (Signed)
 Addended by: DRENA MARTINIS, Randall Rampersad L on: 08/04/2024 06:07 PM   Modules accepted: Orders

## 2024-08-24 NOTE — Progress Notes (Unsigned)
 Assessment/Plan:   1.  Parkinsons disease, diagnosed 2015, with symptoms several years prior             -Given the number of years that patient has had this diagnosis, he looks quite well.  He and I discussed this.             -Patient has motor fluctuations with off time, but it really is quite well-managed with medication.               -He will continue with carbidopa/levodopa 25/100, 1.5 tablets 5 times per day (7 AM/10 AM/1 PM/5 PM/10 PM)             -He will continue carbidopa/levodopa 25/100 CR, 1 tablet 5 times per day at the same time as the IR             -Patient has tried and failed multiple medications             -Patient is not currently interested in DBS therapy.             -We discussed Vyalev, which is foscarbidopa/foslevodopa pump that is newly FDA approved.  We discussed that it is for motor fluctuations in adults with advanced Parkinson's disease.  We discussed that this likely will not be on Medicare formulary until the latter half of 2025.  We discussed risks and benefits of this drug.              -We discussed inapgo, which was just recently FDA approved pump.             - Patient participated in the Parkinsons Disease GeneRation study and was neg for the genes tested.             -We discussed the importance of treating his mental and physical health.  I think he is doing great with his physical health and his exercise, but encouraged him to remember his mental health as well, especially now that he has retired.  We did discuss counseling.  We discussed that 75% of patients with Parkinson's will need medication across their lifetime for mood.   2.  Bradycardia             -Patient under the management of cardiology and currently wearing a monitor for near syncope.   Subjective:   Tyler Castro was seen today in follow up for Parkinsons disease.  My previous records were reviewed prior to todays visit as well as outside records available to me. Pt denies falls.   He has had some lightheadedness/dizziness.  He talked to Dr. Nishan about this.  Notes are reviewed from his April visit with Dr. Delford.  Patient did wear a 30-day monitor which demonstrated his known bradycardia but overall looked good.  No hallucinations.  Mood has been good.  Current prescribed movement disorder medications: Carbidopa/levodopa 25/100, 1.5 tablets 5 times per day (7am/10am/1pm/5pm/10pm) Carbidopa/levodopa 25/100 CR, 1 tablet 5 times per day Clonazepam  0.5 mg, 1.5 tablets at bedtime    PREVIOUS MEDICATIONS: carbidopa/levodopa; entacapone (stomach cramps); pramipexole (stomach cramps at 0.25 mg 3 times per day); rotigotine was given but not taken due to cost; opicapone (prescribed but not taken as patient was worried about side effects); selegiline (stomach cramps); amantadine; Inbrija (cough)   ALLERGIES:   Allergies  Allergen Reactions   Sulfa Antibiotics     Liver function abnormal    CURRENT MEDICATIONS:  No outpatient medications have been marked as taking for  the 08/26/24 encounter (Appointment) with Sahana Boyland, [place] RAMAN, DO.     Objective:   PHYSICAL EXAMINATION:    VITALS:  There were no vitals filed for this visit.  GEN:  The patient appears stated age and is in NAD. HEENT:  Normocephalic, atraumatic.  The mucous membranes are moist. The superficial temporal arteries are without ropiness or tenderness. CV:  RRR Lungs:  CTAB Neck/HEME:  There are no carotid bruits bilaterally.  Neurological examination:  Orientation: The patient is alert and oriented x3. Cranial nerves: There is good facial symmetry with*** facial hypomimia. The speech is fluent and clear. Soft palate rises symmetrically and there is no tongue deviation. Hearing is intact to conversational tone. Sensation: Sensation is intact to light touch throughout Motor: Strength is at least antigravity x4.  Movement examination: Tone: There is mild increased tone in the RUE.  Tone elsewhere is  nl Abnormal movements: mild tremor is felt in the R hand with distraction only; there is mild R hand dystonia; rare head dyskinesia, only when distracted Coordination:  There is no significant decremation with RAM's, with any form of RAMS, including alternating supination and pronation of the forearm, hand opening and closing, finger taps, heel taps and toe taps.  Gait and Station: The patient has no difficulty arising out of a deep-seated chair without the use of the hands. The patient's stride length is good.  The patient has a neg pull test.        Total time spent on today's visit was ***30 minutes, including both face-to-face time and nonface-to-face time.  Time included that spent on review of records (prior notes available to me/labs/imaging if pertinent), discussing treatment and goals, answering patient's questions and coordinating care.  Cc:  Pa, Theatre stage manager [employer]

## 2024-08-25 NOTE — Progress Notes (Signed)
 CARDIOLOGY CONSULT NOTE       Patient ID: Tyler Castro MRN: [id number] DOB/AGE: [birth year] 68 y.o.  Referring Physician: Auston Primary Physician: [facility] Primary Cardiologist: Delford Reason for Consultation: CAD   HPI:  68 y.o. referred by Dr Auston for CAD First seen by me 02/11/24 He had cath in 2014 by Dr Claudene with no obstructive dx but had calcified coronary arteriesHistory of statin intolerance History of Parkinson's. LDL runs in the 130 range Primary had discussed Repatha in past but does not look like it was ever started He has had more progressive exercise intolerance with dyspnea and fatigue but no chest pain   Review of cath showed only fluoroscopic calcium  in LAD. Eccentric < 30% distal RCA stenosis. Mention ? False positive nuclear study  Had myovue 01/18/22 Normal pefusion EF 57%   He use to see Harlene Cork with [facility] neuro His mom had Parkinson's He was diagnosed clinically 10 years ago no need for DAT. He walks his dog Beau daily. After about 20 minutes feels weak , dizzy and as if his HR can't keep up Feels better standing and just slowing down. Similarly at [facility] after 20 minutes can get this weak spells. No chest pain  Wearing his event monitor Does not feel like he could walk on treadmill to test chronotropic competence without legs giving out TTE 03/05/24 reviewed EF normal mild MR aorta 4.0 cm moderate LAE   Monitor 03/23/24 average HR 56 no AV block PVC burden 2%   He is a retire patent Clinical research associate for [employer] Has a daughter who is Clinical research associate in [place] and son working with [employer] crew in [place].   I referred him to Dr Tat for continuity of care for his Parkinson's She saw him on 02/23/24 she kept him on same meds and discussed multiple options for future  Got grant to start Praluent  06/03/24 needs updated lipid/liver Repeat labs today  Excited to start dopa pump in next 2 weeks   ROS All other systems reviewed and negative except as  noted above  Past Medical History:  Diagnosis Date   Arthritis    shoulder   BPH (benign prostatic hyperplasia)    History of hepatitis 12/17/2003   chemical sulfa   Seasonal allergies     Family History  Problem Relation Age of Onset   Parkinsonism Mother    Parkinson's disease Mother    Hypertension Father    Heart disease Father        bypass age 42   Hyperlipidemia Father    CAD Father    Parkinson's disease Brother    CAD Maternal Grandmother    Heart attack Maternal Grandmother    Stroke Paternal Grandmother     Social History   Socioeconomic History   Marital status: Married    Spouse name: Not on file   Number of children: Not on file   Years of education: Not on file   Highest education level: Not on file  Occupational History   Occupation: Civil Service fast streamer    Comment: retired  Tobacco Use   Smoking status: Never   Smokeless tobacco: Not on file  Substance and Sexual Activity   Alcohol use: Yes    Comment: beer/wine with dinner   Drug use: No   Sexual activity: Not on file  Other Topics Concern   Not on file  Social History Narrative   Right handed    Retired    [employer]   Financial Resource Strain: Not on file  Food Insecurity: Not on file  Transportation Needs: Not on file  Physical Activity: Not on file  Stress: Not on file  Social Connections: Not on file  Intimate Partner Violence: Not on file    Past Surgical History:  Procedure Laterality Date   KNEE SURGERY Right 2002   LEFT HEART CATHETERIZATION WITH CORONARY ANGIOGRAM N/A 08/19/2013   Procedure: LEFT HEART CATHETERIZATION WITH CORONARY ANGIOGRAM;  Surgeon: Victory LELON Claudene DOUGLAS, MD;  Location: [facility] CATH LAB;  Service: Cardiovascular;  Laterality: N/A;   MENISCUS REPAIR Left 2002   PROSTATECTOMY  2011   SHOULDER SURGERY Right 1980      Current Outpatient Medications:    acetaminophen  (TYLENOL ) 325 MG tablet, Take 325 mg by mouth every 4 (four) hours as needed., Disp: ,  Rfl:    Alirocumab  (PRALUENT ) 150 MG/ML SOAJ, Inject 1 mL (150 mg total) into the skin every 14 (fourteen) days. Please apply [employer] grant and deliver, Disp: 2 mL, Rfl: 11   carbidopa-levodopa (SINEMET IR) 25-100 MG per tablet, Take by mouth. 1 and 1/2 tabs  4 times per day, Disp: , Rfl: 0   Carbidopa-Levodopa ER (SINEMET CR) 25-100 MG tablet controlled release, Take 1 tablet by mouth 5 (five) times daily., Disp: , Rfl:    clonazePAM  (KLONOPIN ) 0.5 MG tablet, TAKE 1 AND 1/2 TABLETS(0.75 MG) BY MOUTH AT BEDTIME, Disp: 45 tablet, Rfl: 4   MAGNESIUM PO, Take by mouth. TAKE ONE PILL DAILY, Disp: , Rfl:     Physical Exam: There were no vitals taken for this visit.  Standing BP systolic drops to 90 mmHg but patient not symptomatic   Affect appropriate Healthy:  appears stated age HEENT: normal Neck supple with no adenopathy JVP normal no bruits no thyromegaly Lungs clear with no wheezing and good diaphragmatic motion Heart:  S1/S2 no murmur, no rub, gallop or click PMI normal Abdomen: benighn, BS positve, no tenderness, no AAA no bruit.  No HSM or HJR Distal pulses intact with no bruits No edema Neuro non-focal  fairly classic parkinson's with masked facies, shuffling gait and peripheral rigidity Slow speech Skin warm and dry No muscular weakness   Labs:  No results found for: WBC, HGB, HCT, MCV, PLT No results for input(s): NA, K, CL, CO2, BUN, CREATININE, CALCIUM , PROT, BILITOT, ALKPHOS, ALT, AST, GLUCOSE in the last 168 hours.  Invalid input(s): LABALBU No results found for: CKTOTAL, CKMB, CKMBINDEX, TROPONINI  Lab Results  Component Value Date   CHOL 176 12/04/2015   CHOL 186 06/05/2015   CHOL 188 03/27/2015   Lab Results  Component Value Date   HDL 67 12/04/2015   HDL 70 06/05/2015   HDL 66 03/27/2015   Lab Results  Component Value Date   LDLCALC 98 12/04/2015   LDLCALC 106 (H) 06/05/2015   LDLCALC 111 (H) 03/27/2015    Lab Results  Component Value Date   TRIG 54 12/04/2015   TRIG 52 06/05/2015   TRIG 53 03/27/2015   Lab Results  Component Value Date   CHOLHDL 2.6 12/04/2015   No results found for: LDLDIRECT    Radiology: No results found.   EKG: SB rate 48 no AV block   ASSESSMENT AND PLAN:   CAD: non obstructive on cath 2014 Normal myovue 2023 No chest pain I don't think his symptoms are related to ischemia HLD:  intolerant to statins. Started on Praluent  05/2024 update labs  Parkinson's  continue sinemet fairly  severe clinically F/U Dr Tat To stasrt Dopa pump soon Excited for new Rx Autonomic:  dysfunction some postural hypotension. On Sinemet He does not seem symptomatic enough to start midodrine  Bradycardia:  previous long distance runner always low HR.  ECG SB rate 48 at rest no AV block Not on any AV nodal drugs Concern for chronotropic incompetence as cause of his symptoms Does not want ETT Monitor with average HR 56 bpm no AV block    Lipid/Liver on PSK9 F/U Tat for Parkinsons   F/U in  6 months  Signed: Maude Emmer 08/25/2024, 8:04 AM

## 2024-08-26 ENCOUNTER — Ambulatory Visit: Payer: MEDICARE | Admitting: Neurology

## 2024-08-26 VITALS — BP 122/74 | HR 52 | Ht 75.5 in | Wt 213.8 lb

## 2024-08-26 DIAGNOSIS — G20B2 Parkinson's disease with dyskinesia, with fluctuations: Secondary | ICD-10-CM | POA: Diagnosis not present

## 2024-08-26 NOTE — Patient Instructions (Signed)
 Join us  on social media and stay up to date with what is going on in the [place] regarding Parkinson's Disease and Movement Disorders!

## 2024-09-01 ENCOUNTER — Other Ambulatory Visit: Payer: Self-pay

## 2024-09-01 ENCOUNTER — Encounter (HOSPITAL_BASED_OUTPATIENT_CLINIC_OR_DEPARTMENT_OTHER): Payer: Self-pay

## 2024-09-03 ENCOUNTER — Ambulatory Visit: Attending: Cardiovascular Disease | Admitting: Cardiovascular Disease

## 2024-09-03 ENCOUNTER — Other Ambulatory Visit: Payer: Self-pay

## 2024-09-03 ENCOUNTER — Encounter: Payer: Self-pay | Admitting: Cardiovascular Disease

## 2024-09-03 VITALS — BP 108/60 | HR 48 | Resp 14 | Ht 75.5 in | Wt 211.6 lb

## 2024-09-03 DIAGNOSIS — G20A1 Parkinson's disease without dyskinesia, without mention of fluctuations: Secondary | ICD-10-CM | POA: Diagnosis present

## 2024-09-03 DIAGNOSIS — E782 Mixed hyperlipidemia: Secondary | ICD-10-CM

## 2024-09-03 DIAGNOSIS — I2510 Atherosclerotic heart disease of native coronary artery without angina pectoris: Secondary | ICD-10-CM | POA: Diagnosis present

## 2024-09-03 DIAGNOSIS — E785 Hyperlipidemia, unspecified: Secondary | ICD-10-CM | POA: Diagnosis present

## 2024-09-03 NOTE — Patient Instructions (Signed)
 Medication Instructions:  Your physician recommends that you continue on your current medications as directed. Please refer to the Current Medication list given to you today.  *If you need a refill on your cardiac medications before your next appointment, please call your pharmacy*  Lab Work: Your physician recommends that you have lab work today- Lipid and liver panel If you have labs (blood work) drawn today and your tests are completely normal, you will receive your results only by: MyChart Message (if you have MyChart) OR A paper copy in the mail If you have any lab test that is abnormal or we need to change your treatment, we will call you to review the results.  Testing/Procedures: None ordered today.  Follow-Up: At [facility], you and your health needs are our priority.  As part of our continuing mission to provide you with exceptional heart care, our providers are all part of one team.  This team includes your primary Cardiologist (physician) and Advanced Practice Providers or APPs (Physician Assistants and Nurse Practitioners) who all work together to provide you with the care you need, when you need it.  Your next appointment:   6 months  Provider:   Dr. Delford  We recommend signing up for the patient portal called MyChart.  Sign up information is provided on this After Visit Summary.  MyChart is used to connect with patients for Virtual Visits (Telemedicine).  Patients are able to view lab/test results, encounter notes, upcoming appointments, etc.  Non-urgent messages can be sent to your provider as well.   To learn more about what you can do with MyChart, go to [email or web address].   Other Instructions

## 2024-09-04 LAB — HEPATIC FUNCTION PANEL
ALT: 6 IU/L (ref 0–44)
AST: 11 IU/L (ref 0–40)
Albumin: 4.2 g/dL (ref 3.9–4.9)
Alkaline Phosphatase: 83 IU/L (ref 47–123)
Bilirubin Total: 0.4 mg/dL (ref 0.0–1.2)
Bilirubin, Direct: 0.12 mg/dL (ref 0.00–0.40)
Total Protein: 6.6 g/dL (ref 6.0–8.5)

## 2024-09-04 LAB — LIPID PANEL
Chol/HDL Ratio: 2.2 ratio (ref 0.0–5.0)
Cholesterol, Total: 101 mg/dL (ref 100–199)
HDL: 46 mg/dL (ref >39)
LDL Chol Calc (NIH): 42 mg/dL (ref 0–99)
Triglycerides: 54 mg/dL (ref 0–149)
VLDL Cholesterol Cal: 13 mg/dL (ref 5–40)

## 2024-09-08 ENCOUNTER — Ambulatory Visit: Payer: Self-pay

## 2024-09-15 ENCOUNTER — Telehealth: Payer: Self-pay | Admitting: Neurology

## 2024-09-15 ENCOUNTER — Encounter: Payer: Self-pay | Admitting: Neurology

## 2024-09-15 NOTE — Telephone Encounter (Signed)
 Received message from after hours services. Caller Is [employer]. Caller needing clarification on the vile it's missing the quantity and potential off label dose.

## 2024-09-16 NOTE — Telephone Encounter (Signed)
 Called and sent paperwork

## 2024-09-28 ENCOUNTER — Other Ambulatory Visit (HOSPITAL_COMMUNITY): Payer: Self-pay

## 2024-10-08 ENCOUNTER — Ambulatory Visit: Admitting: Neurology

## 2024-10-18 ENCOUNTER — Other Ambulatory Visit (HOSPITAL_COMMUNITY): Payer: Self-pay

## 2024-10-18 ENCOUNTER — Encounter: Payer: Self-pay | Admitting: Neurology

## 2024-10-18 ENCOUNTER — Other Ambulatory Visit: Payer: Self-pay

## 2024-10-18 MED ORDER — CARBIDOPA-LEVODOPA ER 25-100 MG PO TBCR
1.0000 | EXTENDED_RELEASE_TABLET | Freq: Every day | ORAL | 0 refills | Status: DC
  Filled 2024-10-18: qty 150, 30d supply, fill #0

## 2024-10-18 MED ORDER — CARBIDOPA-LEVODOPA 25-100 MG PO TABS
ORAL_TABLET | ORAL | 0 refills | Status: DC
  Filled 2024-10-18: qty 180, 30d supply, fill #0

## 2024-10-19 ENCOUNTER — Encounter: Payer: Self-pay | Admitting: Pharmacist

## 2024-10-19 ENCOUNTER — Other Ambulatory Visit: Payer: Self-pay

## 2024-10-20 ENCOUNTER — Other Ambulatory Visit: Payer: Self-pay

## 2024-10-20 ENCOUNTER — Other Ambulatory Visit (HOSPITAL_COMMUNITY): Payer: Self-pay

## 2024-10-20 DIAGNOSIS — G20B2 Parkinson's disease with dyskinesia, with fluctuations: Secondary | ICD-10-CM

## 2024-10-20 MED ORDER — CARBIDOPA-LEVODOPA ER 25-100 MG PO TBCR
1.0000 | EXTENDED_RELEASE_TABLET | Freq: Every day | ORAL | 0 refills | Status: DC
Start: 2024-10-20 — End: 2024-10-22

## 2024-10-20 MED ORDER — CARBIDOPA-LEVODOPA 25-100 MG PO TABS
ORAL_TABLET | ORAL | 0 refills | Status: DC
Start: 2024-10-20 — End: 2024-10-22

## 2024-10-21 ENCOUNTER — Encounter: Payer: Self-pay | Admitting: Neurology

## 2024-10-21 NOTE — Patient Instructions (Signed)
 Congratulations!  You have gotten started on the Vyalev pump.   VYALEV Complete also offers a 24/7 Hotline with trained nurse support to provide you with tips, troubleshooting advice, and answers to questions.  You may call [phone] with questions or concerns about your device.  If you have forgotten the steps you do to prepare yourself and prime and change your syringe, there is a video you can watch to assist if you would like.   You can go to:  [email or web address]  Please remember that the most important thing is hygiene!  Keep the area CLEAN.  Even if you don't shower every day, we want you to wash your abdomen with soap and water, followed by cleaning the area with the alcohol pad before you place the cannula.  Also remember that we want you to change your site every 24 hours.  Some of the nurses (who are great) may give you other directions based on FDA studies, but we want you to change the site daily to decrease your risk of infection.  If you don't have access to a computer to watch the above video, here is a written transcript of that video:  Before you administer VYALEV, you should be shown how to do it correctly by a healthcare provider.  This demonstration is intended to help you become familiar with the VYALEV delivery system and be used as a supplemental resource as you make infusing VYALEV part of your routine. Before you start VYALEV for the first time, and also each time you get a refill, please use the Medication Guide and the Instructions for Use materials that came with your drug vials, pump and other supplies from your Specialty Pharmacy. Use VYALEV exactly as your healthcare provider tells you to use it.  SUPER/VO: USE VYALEV is a prescription medicine used for treatment of advanced Parkinson's disease in adults. VYALEV contains two medicines, foscarbidopa and foslevodopa.  SUPER: Please see Important Safety Information included  in this video. Please see Instructions For Use, and full Prescribing Information, along with Medication Guide, which was provided with your pump.  SUPER: How to Use Your Delivery System  Tyler Castro: Hi! I'm Tyler Castro. I'm looking forward to helping you today.  (VO) Tyler Castro: I'd like to talk to you about using the [facility] Delivery System.  Tyler Castro: You were trained on how to use your system at the doctor's office. This video will help you remember what you learned.  You'll receive some Instructions for Use materials with the pump and supplies.  (VO) Tyler Castro: They explain the different supplies and how to use them, so read them over before beginning.  Tyler Castro: You'll also have your own dedicated VYALEV Complete Nurse Ambassador. They'll be your go-to person for support throughout your VYALEV treatment...  SUPER: Nurse Ambassadors are provided by [employer] and do not work under the direction of your health care professional (HCP) or give medical advice. They are trained to direct patients to their HCP for treatment-related advice, including further referrals.  Tyler Castro: ...and will visit you at home to help you practice.  SUPER: Your Nurse Ambassador will visit you at home  Nurse Ambassadors are provided by [employer] and do not work under the direction of your health care professional (HCP) or give medical advice. They are trained to direct patients to their HCP for treatment-related advice, including further referrals.  Tyler Castro: Learning your new VYALEV delivery system may take some time, but with practice and patience, you'll get there.  Tyler Castro: First, we'll go over the  5 phases of setting up your infusion. You can remember them as the 5 Ps: Plan It Out, Prepare the Syringe, Prime the Tubing, Place the Cannula, and Pump the Medication.  SUPER: The 5 Ps Plan It Out Prepare the Syringe  Prime the Tubing Place the Cannula Pump the Medication  Tyler Castro: First is the Plan It Out phase. Here you will check your  supplies and get ready to begin.  SUPER: The 5 Ps  Plan It Out Prepare the Syringe Prime the Tubing  Place the Cannula Pump the Medication  Tyler Castro: Second is the Prepare the Syringe phase. In this phase, you transfer the medication from the vial to the syringe.  SUPER: The 5 Ps  Plan It Out Prepare the Syringe  Prime the Tubing  Place the Cannula Pump the Medication  Tyler Castro: The third phase is Prime the Tubing-- you'll fill the infusion tubing with the medication.  SUPER: The 5 Ps  Plan It Out  Prepare the Syringe Prime the Tubing  Place the Cannula Pump the Medication  (VO) Tyler Castro: The fourth is Place the Cannula. You'll put the cannula into the skin of your belly and connect the tubing.  SUPER: The 5 Ps  Plan It Out  Prepare the Syringe  Prime the Tubing  Place the Cannula  Pump the Medication  (VO) Tyler Castro: And fifth phase is Pump the Medication. You'll start the pump for your continuous infusion and use its carrying case.  SUPER: The 5 Ps  Plan It Out Prepare the Syringe Prime the Tubing  Place the Cannula Pump the Medication  Tyler Castro: Then, we'll go over instructions you'll need to follow when certain situations come up, like how to use the different settings on your pump, what you need to do when you shower, and more. At the end, we'll look at your [facility] resources and talk about how they can help.  SUPER: The 5 Ps  Plan It Out  Prepare the Syringe Prime the Tubing  Place the Cannula  Pump the Medication  Additions to Your Routine VYALEV Complete Resources  Tyler Castro: Let's get started.  (VO) Tyler Castro: The first phase is to Plan It Out.  SUPER: Phase 1: Plan It Out  (VO) Tyler Castro: First, wash your hands thoroughly with soap and water for at least 20 seconds. Scrub the front and back of both hands, between your fingers, and under your nails. Dry them with a new paper towel. We'll wash our hands and clean our workspace a few times as we go  along.  SUPER: Wash hands thoroughly for 20 seconds  Scrub:  Front and back of hands Between fingers Under your nails Dry with new paper towel  Tyler Castro: This is called aseptic technique. That's just a medical term for making sure everything we touch stays as clean as possible. Keeping you and your workspace clean can help reduce the risk of infection and irritation at your infusion site.  SUPER: Aseptic technique = practicing your clean routine  (VO) Tyler Castro: Next, grab your medication vial. If it was in the fridge, be sure to let it sit at room temperature for 30 minutes before you use it. Don't warm the medication up any other way. Just remember to keep it out of direct sunlight.  SUPER: [icon] If refrigerated, let medication sit at room temperature for 30 minutes. [icon] It can be kept at room temperature for up to 28 days.  (VO) Tyler Castro: Next, we'll make some space to work. You can use  any flat surface. Be sure to thoroughly clean the area and dry it with a new paper towel.  SUPER: Clean your workspace  (VO) Tyler Castro: Next, gather your supplies. Then we'll check that they're safe to use. When gathering your supplies...  SUPER: Gather and inspect supplies  Tyler Castro: ...be sure to keep them in their packaging until you are ready to use them.  SUPER: Keep supplies in packaging until ready to use  (VO) Tyler Castro: Here's your pump, medication vial, vial adapter, and syringe. Your infusion set includes your tubing, cannula insertion device, and cannula all in the same package. You'll also want to grab a new paper towel and alcohol pads. The only other thing you'll need is a fully charged pump battery.  SUPER: Pump, Medication Vial, Vial Adapter, Syringe, Infusion Set, New Paper Towel, Alcohol Pads, Battery Optional: Consider using transparent film dressing to ensure the infusion set stays in place.  Tyler Castro: Be sure that you have some extra supplies nearby in case you need them.  (VO)  Tyler Castro: Take a look at the packaging on each of the supplies, and check for damage and expiration dates.  Tyler Castro: If something's damaged or expired, don't use it. Contact your Specialty Pharmacy for replacements. Inspect your medication vial, too. Be sure the vial reads "VYALEV."  SUPER: A Specialty Pharmacy is a type of pharmacy that handles medication for conditions like advanced Parkinson's. These medications often need special storage and processing.  Tyler Castro: The color might be different from the one we have here. That's okay.  Tyler Castro: But the solution should not be cloudy. You shouldn't see any particles or flakes floating in it. If the solution doesn't look right, don't use that medication vial.  SUPER: The medication vial... [X]  Should not be cloudy [X]  Should not have particles or flakes [X]  Should not be expired If the solution doesn't look right, don't use that vial  Tyler Castro: You'll also need to put a fully charged battery in your pump. Your pump comes with 2 batteries. Always keep one charging while the other is being used in your pump. This way, you'll have a fully charged battery ready when you need it.  SUPER: Refer to your Battery Charger Patient Instructions for Use for more information. Always keep one battery charging  (VO) Tyler Castro: Match the metal strips between the battery and battery compartment, and insert the battery. You'll hear a "CLICK" when the battery is in place. Once installed, you'll hear the pump make some start-up sounds as it gets ready for use.  (VO) Tyler Castro: Now, wash your hands a second time. We want to make sure our hands are as clean as possible before we use our supplies.  SUPER: Wash hands again with soap and water for 20 seconds  (VO) Tyler Castro: Dry them with a new paper towel.  SUPER: Dry with a new paper towel  (VO) Tyler Castro: That was phase 1--Plan It Out. Here's a quick summary. Feel free to pause the video if you'd like to review the steps, or go back  if you'd like to re-learn this phase.  SUPER: Let's review phase 1: Plan It Out  Wash your hands thoroughly Grab your medication vial. If it was refrigerated, let it sit at room temperature for 30 minutes Clean your workspace Gather and inspect your supplies Wash your hands thoroughly a second time (VO) Tyler Castro: The second phase is "Product Manager."  SUPER: Phase 2: Prepare the Syringe  Tyler Castro: In this phase, you'll get your medication ready to  use and place the filled syringe in the pump.  SUPER: Refer to your Instructions for Use of VYALEV.  (VO) Tyler Castro: Take off the medication vial's cap. Then, use one of your alcohol pads to clean the top of the vial. Give it 5 seconds to air-dry. This helps ensure everything stays as clean as possible.  SUPER: [Clock icon] Allow 5 seconds to dry  (VO) Tyler Castro: This is the vial adapter. It attaches to the top of your medication vial. It helps you transfer the medication from the vial into the syringe.  SUPER: Refer to your Vial Adapter Patient Instructions for Use.  (VO) Tyler Castro: Leave the vial adapter in the outer protective packaging. Peel back the cover, like this.  (VO) Tyler Castro: Hold the vial in one hand, on a flat workspace. Then, hold the adapter by the outer protective packaging in the other hand, and place it on top of the vial. Press down until you hear a SNAP sound. That's how you know you did it right!  Tyler Castro: Once you insert the vial adapter, the outer protective packaging can come off. The vial adapter will still be attached. You can throw the packaging away in your regular trash.  (VO) Tyler Castro: Take care not to touch the exposed end of the vial adapter.  SUPER: Don't touch exposed end of the vial adapter  (VO) Tyler Castro: The syringe is used to draw out the medication from the vial. Take the syringe out of its package.  (VO) Tyler Castro: Make sure you don't touch the syringe tip to any unclean surfaces to help avoid contamination. If it  does touch, throw it away and get a new one.  SUPER: If the syringe tip touches an unclean surface, you'll need to discard it and get a new one  (VO) Tyler Castro: Be certain the plunger is pushed in all the way. This makes sure all the air is out, so you can fill the syringe with your medication.  SUPER: Push plunger all the way in  (VO) Tyler Castro: Hold your syringe vertically, with the tip pointing down. Press the tip into your vial adapter to insert it. Turn it clockwise as you go.  SUPER: Don't overtighten  Tyler Castro: Once the syringe is inserted, flip it so the vial is on top, like this. Then, pull the plunger slowly all the way down. Withdraw the full contents of the vial into...  SUPER: Withdraw all the medication  (VO) Tyler Castro: ...the syringe to around the 12 mL mark. You will see air at the tip of the syringe.  SUPER: Withdraw all the medication 12mL  Tyler Castro: Next, check for large air bubbles. They may form when filling the syringe with the medication.  (VO) Tyler Castro: The tiny air bubbles may be hard to get out. That's okay. It's these larger ones you'll want to make sure to remove. They may affect your dosage and delay your medication's flow. Let's get rid of those bubbles.  SUPER: Check for large air bubbles  (VO) Tyler Castro: Slowly and gently rotate and tilt the syringe, like this. Do this a few times.  SUPER: Remove large air bubbles  (VO) Tyler Castro: You'll know you're doing it right when the air bubbles start to gather at the tip.  SUPER: Air bubbles will gather and disappear  (VO) Tyler Castro: Do not shake or tap the syringe to remove the air bubbles.  SUPER: Don't shake or tap the syringe  (VO) Tyler Castro: If the air bubbles won't budge, hold the syringe vertically, with the tip  facing down. Then rotate it so the tip is facing up. Gently turn it back and forth a few times.  Tyler Castro: You'll push out the air that gathered here. Slowly press the plunger in. This will push the air out of your  syringe and into the vial. Keep the syringe straight and try not to tilt it. This prevents another air bubble from forming. Continue pushing until all of the air is pushed out of the syringe and into the solution vial...  (VO) Tyler Castro: ...and there is solution visible in the syringe tip. You might feel some resistance. That's normal, keep going! Stop pressing the plunger when all the air is out.  (VO) Tyler Castro: Flip the syringe and vial so that the vial is right side up on the table. Now gently pull and turn the syringe counterclockwise to take it out. Be careful not to push the plunger or else the solution will leak. And that's it! The syringe is ready.  (VO) Tyler Castro: Make sure to lay the syringe down carefully, without touching the tip to any unclean surface. If it does touch, throw it away and get a new one.  SUPER: If the syringe tip touches an unclean surface, discard it and get a new one  Tyler Castro: Next, we'll connect the tubing, syringe, and pump together.  SUPER: Refer to your Infusion Set Patient Instructions for Use.  (VO) Tyler Castro: The infusion tubing is a long, thin tube that allows your medication to flow from the pump.  SUPER: Refer to your Infusion Set Patient Instructions for Use. Infusion tubing  (VO) Tyler Castro: It's packaged with your cannula insertion device. Take the tubing out of its package and remove the paper. You'll notice that your tubing has 2 different ends.  SUPER: Cannula insertion device  (VO) Tyler Castro: This end is called the site connector. It'll attach to the cannula on your belly.  SUPER: If the tubing ends touch an unclean surface, you'll need to discard it and get a new one. Site connector  (VO) Tyler Castro: This end is called the pump connector. It attaches to the syringe tip.  SUPER: If the tubing ends touch an unclean surface, you'll need to discard it and get a new one. Pump connector  (VO) Tyler Castro: Hold your syringe with the tip pointing up. Take the pump connector  end of the tubing. Attach it to the syringe by pressing and turning it clockwise.  (VO) Tyler Castro: Now we'll place the syringe inside your pump. Press any button to turn the pump display screen on. First press the MENU button. You'll see a few options. Highlight "Insert Syringe" and press "Select." Slide the latch to open the pump's lid.  SUPER: Refer to your VYALEV Pump Patient Instructions for Use.  (VO) Tyler Castro: Line up the syringe with the grooves inside the pump. The tip that's connected to your tubing will stick out. Close the lid. When you hear a SNAP sound--just like that! --it's fully closed and you're ready to keep going!  (VO) Tyler Castro: Click "Yes" to confirm the syringe is inside. The pump will prepare your medication for use!  (VO) Tyler Castro: If the syringe does not fit inside the pump, check to see if the syringe plunger rod pusher in the pump has moved to the correct position. Or check to see if the air or headspace has been removed from the syringe.  SUPER: Check the syringe plunger rod pusher Check that the air or headspace has been removed  (VO) Tyler Castro: That's all for phase  2--Preparing the Syringe. Here's a quick summary. Again, feel free to pause the video if you'd like to review the steps, or go back if you'd like to re-learn this phase.  SUPER: Let's review phase 2: Prepare the Syringe  Use the vial adapter to transfer medication from the vial to the syringe Connect the tubing to the syringe Place the filled syringe in your pump. Follow the on-screen prompts to confirm the syringe is in (VO) Tyler Castro: Next, we'll prime the tubing. But before I show you how to prime...  SUPER: Phase 3: Prime the Tubing  (VO) Tyler Castro: ...I'll explain what it means and why it's important. Priming is when you fill the infusion tubing with your medication, before connecting it to your body.  SUPER: Refer to your VYALEV Pump Patient Instructions for Use. Priming = filling new tubing with  medication  (VO) Tyler Castro: A small amount of medication will flow through the tubing, out of the site connector and onto a new paper towel. You'll need to prime whenever you use new tubing. Let's take a look at how to do it.  (VO) Tyler Castro: Press any button to turn on your pump. The display screen will ask if you'd like to prime the tubing. Click "Yes." The display screen will then ask you to confirm that the tubing is not connected to the cannula. It should not be connected when priming. Click "Confirm."  (VO) Tyler Castro: We just connected one end of the tubing to the syringe. The other end of the tubing is called the "site connector." Take off the site connector's cover by squeezing the sides and sliding it off.  SUPER: Site connector (graphic circle/line to accompany this)  Tyler Castro: Keep it in a safe place where it'll stay clean. You'll need to cover the site connector if you disconnect from the pump for any reason, like to shower. Then, place the site connector on a new paper towel.  SUPER:  Keep it in a safe place where you will remember it It's used again when you disconnect from the pump (VO) Tyler Castro: Now, hold the pump with the syringe tip facing up, and press "Prime" once. If the pump is not straight up, the "Prime" option will not be available.  SUPER: Must hold pump straight up to prime  (VO) Tyler Castro: Each time you press "Prime", medication will push through the tubing from the syringe toward the site connector end.  (VO) Tyler Castro: Press the button once, and wait to see a drop of medication on the paper towel. If you don't see it after a few moments, press "No" on the pump to prime again.  (VO) Tyler Castro: The medication will eventually reach the end of the tubing and drip onto the paper towel. That means you're doing it right!  Tyler Castro: Your tubing is now primed. Press "Yes." Lay your pump flat on the table. Wait at least 1 minute to make sure the solution stops dripping from the site  connector.  SUPER: [Clock icon] Wait 1 minute  (VO) Tyler Castro: Then, without lifting the site connector from the paper towel, gently tap the tubing right where it meets the site connector to shake off any drops of medication.  Tyler Castro: If we don't shake the droplets off, they could make your site connector sticky and a bit difficult to remove from the cannula. We'll learn more about that later on.  Tyler Castro: That's it! The tubing is primed and ready. Now, it's important to remember: you do not need to do this  every time you change a syringe. Only prime when you are changing the tubing. Your tubing should be changed every time you change your cannula.  SUPER: Only prime when changing the tubing  (VO) Tyler Castro: And we're done with phase 3--Priming the Tubing. Here's a quick summary. Feel free to pause the video if you'd like to review the steps, or go back if you'd like to re-learn this phase.  SUPER: Let's review phase 3: Prime the Tubing  Lay the site connector on a new paper towel Use your pump to prime. You'll do this whenever you use new tubing (VO) Tyler Castro: Next, we'll insert the cannula.  SUPER: Phase 4: Place the Cannula  Tyler Castro: The cannula is inside this insertion device, which helps you put it into the skin of your belly. The tubing will attach to the cannula so the medication can be delivered under the skin.  (VO) Tyler Castro: For demonstration purposes, I'll show you how to insert the cannula using a foam belly. But before that, we need to choose a spot to insert the cannula. This spot is called the "infusion site." The preferred infusion site is on your belly. In some cases, your doctor may recommend inserting the cannula into another part of the body. Talk to your doctor about what's right for you.  SUPER: The belly is the preferred infusion site. Speak to your doctor about what infusion site is right for you. Preferred Infusion Site  Tyler Castro: Avoid skin that doesn't look normal. You'll also  want to avoid areas where clothing might get in the way, like your waist. Choose sites with the least risk of being impacted by physical activity, to prevent your cannula from being knocked out. If needed, remove hair around the insertion site. This will make sure the tape on the cannula stays secure.  SUPER: Choose an infusion site 2 inches away from skin that  Is tender, bruised, red or hard to the touch Naturally bends, creases or has folds Has significant sweat Might interfere with clothing and cause irritation Has body hair. Remove any body hair before inserting the cannula (VO) Tyler Castro: Insert the cannula at least 2 inches away from your belly button. The distance from your fingertip to your knuckle is about 2 inches. Use that as a guide when placing your cannula.  SUPER: 2"  Tyler Castro: Rotate the infusion site by choosing an area that is at least 1 inch from previous sites used in the last 12 days.  SUPER: At least 1 inch away from previous sites used in the last 12 days Rotate your infusion site at least once every 3 days, or more frequently as directed by your doctor.  (VO) Tyler Castro: The distance from the tip of your thumb to your knuckle is about 1 inch.  SUPER: 1"  (VO) Tyler Castro: Wash the infusion site with soap and water.  SUPER: Wash infusion site with soap and water  (VO) Tyler Castro: Then, use an alcohol pad to wipe the insertion area in an outward spiral, not back and forth, to avoid moving bacteria onto the site. Do not blow on it.  SUPER: Wipe your skin  Tyler Castro: Instead, let it air-dry for at least 1 minute so that the tape on the cannula can stick to the skin properly.  SUPER: [icon] Wait 1 minute to air-dry  (VO) Tyler Castro: The cannula insertion device will help you put the cannula in the skin of your belly. When you press this button at the top, it'll insert the cannula  under your skin.  SUPER: Refer to your Infusion Set Patient Instructions for Use.  Tyler Castro: Right now, the  button has this safeguard on it. This makes sure we don't accidentally press the button before we're ready to insert the cannula.  (VO) Tyler Castro: The arrows running down the side indicate where the tubing will be connected after insertion.  (VO) Tyler Castro: On the bottom there's a piece of paper covering up tape. It's like the paper that covers the tape on a bandage. Let's see how it works!  (VO) Tyler Castro: First, remove the paper from the bottom to expose the adhesive tape. Gently pull it in a circular motion so the tape doesn't stick to itself.  (VO) Tyler Castro: Next, take off the safeguard at the top. Gently squeeze the sides and pull it straight out.  Tyler Castro: Keep the safeguard in a clean, safe place, with the site connector cover we removed before. You'll need both when you disconnect from your pump.  SUPER: Store in a clean, safe place  (VO) Tyler Castro: Note the arrows on the insertion device. This shows the side of the cannula where the tubing will be connected.  (VO) Tyler Castro: With the insertion device in one hand, use the other to gently stretch your skin. This will create a taut, flat surface. Then, hold the insertion device against your skin. Now press this red button down completely to insert the cannula. It will make a "CLICK" sound as it's inserted. Just like that!  (VO) Tyler Castro: Keep the device pressed against your skin for 5 seconds to make sure the tape sticks. Then, carefully pull the insertion device away from your body.  SUPER: [Clock icon] Press for 5 seconds  Tyler Castro: The cannula and the tape will stay on your skin. Press the tape down to make sure it's secure. Then, dispose of the insertion device properly in a sharps disposal container.  SUPER: A sharps container is a recycling bin for used needles and other medical supplies.  Tyler Castro: Even if you unsuccessfully insert the cannula, don't reinsert in the same site again or reuse the same cannula. Get a new, unused insertion device and place the  new cannula in a different location. Be sure to use a new insertion device at a new infusion site if the adhesive tape becomes loose or if it needs to be readjusted or replaced.  (VO) Tyler Castro: Now we'll connect the tubing to the cannula. We'll do this by inserting the site connector end into the cannula that we just placed on your belly. Place a finger on the cannula housing and push the site connector in. You'll know you inserted it fully when you hear a CLICK sound. This is important to help prevent any medication from leaking out.  Tyler Castro: Now your VY[facility]EV delivery system is fully connected! Be sure to frequently check your infusion site to ensure the cannula remains in place, that fluid isn't leaking onto the skin and that the site is not irritated. If you experience any redness, pain or swelling, be sure to replace the cannula in a new infusion site and call your doctor.  SUPER: Check infusion site often  Cannula stays in place No leaking No irritation Tyler Castro: You can change your cannula as often as you need to. But it should be changed at least every 3 days or more frequently as directed by your doctor.  SUPER: [icon] Replace your tubing and cannula at least once every 3 days, or more frequently as directed by your doctor  (  VO) Tyler Castro: That's it for phase 4: Placing the Cannula. Here's a quick summary. Feel free to pause the video if you'd like to review the steps, or go back if you'd like to re-learn this phase.  SUPER: Let's review phase 4: Place the Cannula  Choose an infusion site on your belly Prepare the site Place the cannula with the cannula insertion device Attach the tubing to the cannula on your body (VO) Tyler Castro: We made it to the last phase--starting and using your pump.  SUPER: Phase 5: Pump the Medication  (VO) Tyler Castro: Press any button to turn your pump on. Press "Continue". Then press YES. The screen will time out after 20 seconds of inactivity. But the pump won't turn  off unless the battery is removed. Your pump will now start doing its job--giving you your medication.  SUPER: Refer to your [facility] Pump Patient Instructions for Use.  (VO) Tyler Castro: The pump screen will say "Running" in green at the top corner when the medication is flowing. That's how you know you did it right! Your continuous infusion is in progress. Your medication will flow until your syringe runs out.  SUPER: Pump status  (VO) Tyler Castro: The screen will also show a countdown of the time left until you need to replace the syringe with new medication. The time shown is dependent on the dose your doctor prescribed.  Tyler Castro: Keep in mind, once the medication is taken out of the vial, it must be used within 24 hours, or thrown away.  SUPER: Medication must be used and syringe must be replaced within 24 hours.  Tyler Castro: Check on the cannula often. It should not be loose or leaking. The skin around the infusion site shouldn't be irritated. If you're having a skin reaction or irritation, remove the cannula and call your doctor. If your doctor advises to change your cannula, be sure to use a new site at least 2 inches away from the area of irritation.  (VO) Tyler Castro: You also received this carrying case with your pump. Your pump goes in the case, and the strap allows you to wear it throughout your day.  SUPER: Refer to your [facility] Carrying Accessory Patient Instructions for Use.  (VO) Tyler Castro: Unzip the case and place your pump inside, with the buttons facing out so you can see them through the plastic window.  (VO) Tyler Castro: Make sure the syringe tip is lined up with the opening on the side of the case. Now you can zip it up! When wearing your pump, make sure that you don't leave the infusion tubing hanging loose. It could get caught on something and pull out your cannula. Also check that the tubing isn't kinked or folded so the medication's flow isn't blocked.  (VO) Tyler Castro: That's it for phase 5: Pump the  Medication. Here's a quick summary. Feel free to pause the video if you'd like to review the steps, or go back if you'd like to re-learn this phase.  SUPER: Let's review phase 5: Pump the Medication  Follow the screen's prompts to start your pump Check the screen to confirm when the syringe is to be replaced Place your pump in its carrying case Tyler Castro: There are a few more things to go over. But before we move on, let's summarize the 5 Ps. First is the Plan It Out phase: the things you need to do before you use your supplies. Second, Prepare the Syringe. Transfer the medication from the vial into the syringe. Third, Prime the  Tubing. Fill the infusion tubing with your medication, before connecting it to your cannula. Fourth, Place the Cannula. Choose and clean a spot on your skin to insert the cannula and connect the tubing. And lastly, Pump the Medication. Start the pump, and the medication will flow. That's it for the 5 Ps!  SUPER: Continuous Infusion phases: The 5 Ps  Plan It Out  Prepare the Syringe Prime the Tubing  Place the Cannula Pump the Medication  (VO) Tyler Castro: You know how to set up your continuous infusion! Now let's go over a few more things.  SUPER: Additions to Your Routine  Tyler Castro: Like what to do when showering, how to get an extra dose, how to replace supplies, and more.  Tyler Castro: First we'll learn how to stop and restart your pump. Then, we'll talk about how to replace your syringe. We'll also look at how to disconnect and reconnect your pump. There are different sets of instructions for reconnecting, depending on if you've been disconnected for under 1 hour, or over 1 hour. Then, we'll look at how to replace the syringe, infusion tubing and cannula, which you'll do regularly. Next, we'll talk about changing the medication's flow rate, delivering an extra dose, and delivering a loading dose. Last, we'll learn about the different alarms and messages your pump may give  you.  SUPER: Additions to Your Routine:  Stop and restart your pump Replace syringe only Disconnect and reconnect, if under 1 hour Replace infusion tubing and cannula, if over 1 hour Replace syringe, infusion tubing and cannula Flow Rate, Extra Dose and Loading Dose Pump alarms and messages (VO) Tyler Castro: You'll stop and restart your pump whenever you replace your supplies...  SUPER: How to stop and restart your pump  Tyler Castro: ...or disconnect from the delivery system. But remember, if you stop your infusion for more than 1 hour, be sure to replace the cannula and infusion tubing, or they could get blocked up. We'll go over how to do that later on.  (VO) Tyler Castro: To stop your pump, first turn on the pump display screen by pressing any button. Then press MENU. Highlight STOP PUMP and press SELECT. The screen will ask you to confirm that you want to stop the pump. Press YES.  (VO) Tyler Castro: It'll stop your medication's flow. If done correctly, the pump will say "STOPPED" in red on the top right corner.  SUPER: Pump status  (VO) Tyler Castro: When you're ready to restart the pump again, press any button to turn the pump screen on. Press MENU, then highlight START PUMP. Press SELECT, then YES to confirm you want to start the pump again. That's it!  (VO) Tyler Castro: Now we'll go over how to replace the syringe, but keep the same cannula and tubing you're already using.  SUPER: How to replace the syringe only  Tyler Castro: You'll need to change the syringe in your pump at least once every 24 hours. You may have to change it more often because the syringe is empty or nearly empty, depending on the dose prescribed by your healthcare provider. Before we get started, I'll share a few important notes. Since you're using the same tubing as before, you won't need to prime it.  SUPER: Syringe must be changed at least every 24 hours It's okay if there's some medication left in your syringe when it's time to replace  it  (VO) Tyler Castro: You'll want to start replacing your syringe about 30 minutes before the medication in your current syringe is due  to run out.  SUPER: Start preparing a new syringe at 30 minutes  Tyler Castro: Check the countdown on your pump's screen. This way, you won't have to go without your medication while you get the next syringe ready.  Tyler Castro: First, follow the Plan It Out phase instructions we went over at the start of this video.  SUPER: Follow the Plan It Out phase  (VO) Tyler Castro: That includes washing and drying your hands, getting your workspace ready, gathering and inspecting your supplies, and washing your hands a second time.  SUPER:  Plan It Out:  Wash your hands for at least 20 seconds and dry with a new paper towel Get your workspace ready Gather and inspect your supplies Wash and dry your hands a second time Tyler Castro: The only difference this time, is that we won't be needing a few things. So when you gather your supplies, you'll only need your pump, syringe, medication vial, vial adapter, new paper towels, and alcohol pads.  SUPER: You'll need:  Pump Syringe Medication vial Vial adapter New paper towels Alcohol pads (VO) Tyler Castro: Follow the steps we went over during the Prepare the Syringe phase earlier.  SUPER: Follow the Prepare the Syringe phase  Tyler Castro: This includes cleaning the vial top with an alcohol pad. In this step, you'll transfer your medication into your syringe.  Tyler Castro: Turn on your pump display screen by pressing any button. Press MENU to see a few options. Use the arrow keys to highlight CHANGE SUPPLIES. Press SELECT, then press YES. It will stop so you can replace the syringe. Follow the prompts to see the REMOVE SYRINGE menu. Press SELECT. Your pump will get the syringe ready to be removed. When it's done, the display screen will let you know and then prompt you to open the lid.  (VO) Tyler Castro: Slide the latch, and take out the old syringe. If you do  not tell your pump that you're replacing the syringe, your new syringe will not fit.  Tyler Castro: Take the tubing off the old syringe. Attach it to the new syringe tip by pressing and turning it clockwise.  (VO) Tyler Castro: Now we'll place the new syringe inside your pump. Match the grooves and leave the tip out. Make sure it's in place, and close the lid. You'll hear a SNAP when it's completely shut. That's how you know you did it right.  (VO) Tyler Castro: If the syringe doesn't fit inside the pump groove, check to see if the syringe plunger rod pusher is advanced to the correct position and air has been removed. Be sure to discard the used syringe according to your local guidelines.  (VO) Tyler Castro: The pump display screen will ask if the new syringe is in. Click YES, and it'll prepare the new syringe for use. The screen will let you know when it's ready, and will ask if you want to prime again. Click NO, since we didn't replace the tubing. Make sure everything else is still connected. Then click YES to start the pump. That's it!  (VO) Tyler Castro: Next we'll look at how to disconnect from your pump and then reconnect.  SUPER: Disconnecting and reconnecting if it's been under 1 hour  Tyler Castro: Only use these instructions when you want to disconnect for less than 1 hour. This includes when you want to shower or swim. You can't get in the water with your pump or tubing. They cannot get wet.  SUPER: Reconnecting after less than 1 hour  (VO) Tyler Castro: Let's take a look  at how to disconnect. First, turn on the pump screen by pressing any button. Then press the MENU button. Highlight STOP PUMP. Click YES.  (VO) Tyler Castro: Hold the cannula housing so it doesn't move. Take the site connector end of your tubing, and gently squeeze the sides. Pull it straight out.  (VO) Tyler Castro: Once the site connector is removed, lay it on a new paper towel. Gently tap the tubing at the site connector end. This will shake off any drops of medication,  so it doesn't get sticky.  (VO) Tyler Castro: Wait 60 seconds. Then, to protect the site connector, replace the white cover that came with it.  SUPER: [Clock Icon] Wait 60 seconds  (VO) Tyler Castro: And to protect yourself, attach the clean, transparent cannula cover, called the safeguard, that came with the insertion device. Insert the safeguard into the cannula. You'll hear a CLICK! noise when it's fully inserted. Keep the tubing and pump in a clean, dry place.  Tyler Castro: Keep track of the time after you disconnect. There are different ways to reconnect depending on how long it's been since you disconnected.  SUPER: Make note of time you disconnect. There are different ways to reconnect depending on whether you've been disconnected under or over one hour.  Tyler Castro: Here's how to reconnect your pump and tubing if it's been less than an hour since disconnecting.  SUPER: [Clock Icon] Less than 1 hour  (VO) Tyler Castro: Remove the safeguard that's plugging your cannula. Grab your tubing and take off the site connector's cover. Now place a finger on the top of the cannula housing and push the site connector straight into the cannula, like this. You'll hear another "CLICK" when it's in.  (VO) Tyler Castro: Grab your pump. Turn on the screen by pressing any button. Then press the MENU button. Select START PUMP from the options list. Press YES. That's it! The pump will continue your infusion and count down the time left on your syringe.  (VO) Tyler Castro: Before we move on, let's see what to do if you're trying to disconnect but your site connector is hard to remove from your cannula. Sometimes this can happen if the medication makes it sticky.  SUPER: What to do if your site connector is stuck  (VO) Tyler Castro: First, run a clean cloth under really warm water until it's dripping wet.  SUPER: If your site connector is stuck:  Run a clean cloth under warm water (VO) Tyler Castro: Let the wet cloth soak on the site connector for at  least 2 minutes to help dissolve the sticky medication. Gently rub the cloth, too.  SUPER: If your site connector is stuck:  Run a clean cloth under warm water Let it soak on the site connector for 2 minutes. Gently rub the cloth (VO) Tyler Castro: Then, squeeze the sides of the site connector and try to pull it out.  SUPER: If your site connector is stuck:  Run a clean cloth under warm water Let it soak on the site connector for 2 minutes. Gently rub the cloth Squeeze the sides of the site connector and try to pull it out (VO) Tyler Castro: If it's still stuck, try soaking and applying the cloth again. If that doesn't work, remove the cannula with the tubing attached. Replace it with a new cannula and tubing.  SUPER: If it's still stuck, try soaking and applying the cloth again. If that doesn't work:  Remove the cannula with the tubing attached Replace it with a new cannula and new tubing  If you need further help, call your doctor.  (VO) Tyler Castro: You will have to replace your cannula and tubing if you've been disconnected from the pump for over an hour...  SUPER: Replacing tubing and cannula (not syringe) if you've been disconnected for over 1 hour  Tyler Castro: ...or whenever you replace your cannula. Now let's take a look at how to disconnect your cannula and infusion tubing. Then we'll reconnect new ones. We won't be replacing the syringe.  Tyler Castro: First, follow the Plan It Out phase that we went over at the start of this video.  SUPER: Follow the Plan It Out phase  (VO) Tyler Castro: That includes washing and drying your hands, getting your workspace ready, gathering and inspecting your supplies, and washing your hands a second time.  SUPER: Plan It Out:  Wash your hands for at least 20 seconds and dry with a new paper towel Get your workspace ready Gather and inspect your supplies Wash and dry your hands a second time Tyler Castro: Gather your supplies--including the infusion set, alcohol pads, and new  paper towels.  SUPER: Gather new supplies:  Infusion set Alcohol pads New paper towels (VO) Tyler Castro: First, turn on your pump display screen by pressing any button. Press MENU. If your pump is running, press SELECT to choose the STOP PUMP option. Then press YES.  (VO) Tyler Castro: Then take the pump connector end of the tubing off the syringe tip. Turn it counterclockwise, and pull it off.  (VO) Tyler Castro: Now, take out the cannula with the tubing still attached. Carefully loosen the tape from your skin, like you would when pulling off a bandage. Then pull the cannula housing away from your body to take it out.  Tyler Castro: Once it's removed, check the cannula and infusion site to ensure no parts of the cannula were left behind.  SUPER: Dispose of these supplies properly based on your local guidelines.  Tyler Castro: If you think the plastic part of the cannula was detached from the adhesive and is still under your skin, call your healthcare professional.  SUPER: Contact your healthcare professional if you notice any skin changes at the infusion site, such as the spreading of redness, swelling, warmth, pain or discoloration when pressure is applied to the area.  (VO) Tyler Castro: Next, attach your new tubing to the syringe tip. Twist clockwise until it's snug. Don't twist it too tight, though! Since you're using new tubing, you'll need to prime it, like we did earlier.  (VO) Tyler Castro: Select MENU on your pump. Use the arrow keys to highlight CHANGE SUPPLIES and press SELECT. Highlight PRIME INFUSION LINE. Press SELECT, and CONFIRM.  (VO) Tyler Castro: To finish priming the tubing, follow the steps we went over earlier in the Prime the Tubing phase. Don't forget to tap the site connector and wait 60 seconds to avoid a sticky connector. Next, you'll insert a new cannula into the skin of your belly and connect your tubing.  SUPER: [check box] Follow the Prime the Tubing phase  (VO) Tyler Castro: To do this, follow the steps we went  over earlier in the Place the Cannula phase. Be sure to dispose of your insertion device properly in a sharps disposal container. Remember to choose an infusion site that is at least 1 inch from previous sites used in the last 12 days.  SUPER: [check box] Follow the Prime the Tubing phase [check box] Follow the Place the Cannula phase  (VO) Tyler Castro: After that, you're ready to start the pump again. The display  screen will ask you to do this after you connect the tubing to the cannula. Press CONTINUE and then YES.  (VO) Tyler Castro: Now let's take a look at how to replace your syringe, infusion tubing, and cannula.  SUPER: Replacing the syringe, infusion tubing and cannula  Tyler Castro: Your pump will let you know when to change your syringe. You should start this process about 30 minutes before your medication is due to run out.  SUPER: Start preparing a new syringe at 30 minutes  Tyler Castro: Check the countdown on your pump's screen. This way, you won't have to go without your medication while you get the next syringe ready.  Tyler Castro: First, follow the Plan It Out phase we went over at the start of this video.  SUPER: Follow the Plan It Out phase  Tyler Castro: That includes washing and drying your hands, taking your medication vial out and letting it sit at room temperature for 30 minutes if it was refrigerated, getting your workspace ready, gathering and inspecting your supplies, and washing your hands a second time.  SUPER: Plan It Out:  Wash and dry your hands If refrigerated, let your medication sit for 30 minutes before use Get your workspace ready Gather and inspect your supplies Wash and dry your hands again Tyler Castro: Then, follow the steps we went over during the Prepare the Syringe phase. In this step, you'll get your medication ready for use in your syringe.  SUPER: Follow the Prepare the Syringe phase  (VO) Tyler Castro: Next, stop the pump. Turn on the screen by pressing any button. Press MENU, then  SELECT to see the STOP PUMP menu. Click YES. You should see a red "STOPPED" in the upper right corner.  (VO) Tyler Castro: Now we'll take out the cannula with the tubing still attached. Carefully loosen the tape from your skin, like you would when pulling off a bandage. Then pull the cannula housing away from your body to pull it out.  (VO) Tyler Castro: Once it's removed, inspect the cannula and infusion site to ensure no parts of the cannula were left behind.  SUPER: If you think the plastic part of the cannula is still under your skin, call your doctor.  (VO) Tyler Castro: You'll need to take the syringe out of the pump. Press MENU and highlight CHANGE SUPPLIES. Follow the prompts to see the REMOVE SYRINGE menu. Press SELECT.  (VO) Tyler Castro: Your pump will now get the syringe ready to be removed. When it's done, the screen will then ask you to open the lid. Slide the latch and take out the old syringe. Dispose of your used syringe and infusion set properly based on your local guidelines.  (VO) Tyler Castro: The next few steps are all things we've done before. You'll have to connect new tubing to the tip of your new syringe, and place the new syringe in your pump.  SUPER: [check box] Follow the Prepare the Syringe phase [check box] Follow the Prime the Tubing phase [check box] Follow the Place the Cannula phase  (VO) Tyler Castro: You'll need to prime the tubing, since we're using a new one.  (VO) Tyler Castro: Remember to wipe the alcohol pad in an outward spiral, not back and forth, over your infusion site.  (VO) Tyler Castro: Insert the new cannula, and make sure to stay at least 1 inch away from any previous sites used in the last 12 days.  (VO) Tyler Castro: Then insert the site connector.  (VO) Tyler Castro: The last step is to start your pump. After you  connect the tubing, the screen will ask if you want to start. Press CONTINUE and then YES. That's it! Now you know how to use your delivery system when different situations come up.  (VO)  Tyler Castro: Now we'll talk about the infusion dose options you may have in your pump's menu.  SUPER: Flow Rate, Extra Dose and Loading Dose  Tyler Castro: Dose options are different ways to change the amount of medication being infused. But keep in mind, you may not be able to use these options on your pump. It depends on how your doctor programmed it. If you're not sure, give your doctor a call.  SUPER: Call your doctor if you have questions about infusion dose options. Refer to your [facility] Pump Patient Instructions for Use.  (VO) Tyler Castro: The first option is to change your medication's flow rate. The flow rate determines the amount of time one syringe is used and how much medication you're receiving. Here's how you do it. Go to MENU. Then select "Change Rate."  SUPER: Flow Rate = How fast your dose is administered  (VO) Tyler Castro: The second option is to deliver an extra dose of medication. That means you'll get a small amount of medication infused over a short amount of time.  (VO) Tyler Castro: The third option is to deliver a loading dose of medication. A loading dose is a larger dose of your medication that flows more quickly than a typical dose. This is used when you first use your pump or when your pump has been off for at least 3 hours, if enabled by your doctor. They'll set a lockout time for how often a loading dose can be administered.  Tyler Castro: If these options are available for you, ask your doctor how and when you should use them. You can also refer to the Instructions for Use that came with your pump for more information.  SUPER: Call your doctor if you have questions about infusion dose options.  (VO) Tyler Castro: Let's talk about the different messages and alarms your pump will give you.  SUPER: Pump alarms and messages  Tyler Castro: These are intended to alert you that you may need to do something. Here are some examples of alarms and messages you may see on your pump.  (VO) Tyler Castro: Your pump will alert  you when you have 2 hours left until your syringe runs out, and again when you have 45 minutes left on your syringe.  Tyler Castro: At this point, you'll want to remove your next medication vial from the fridge.  SUPER: If refrigerated, remove 30 minutes before use  (VO) Tyler Castro: It'll alert you if your syringe is empty and needs to be replaced. It'll also let you know when your syringe has been in the pump for 24 hours.  Tyler Castro: Remember, the syringe and any unused medication should be thrown away after 24 hours.  SUPER: Your syringe must be disposed of every 24 hours. It's okay if there's some medication left in your syringe when it's time to replace it.  Tyler Castro: There are many other types of alarms and messages that your pump may show you. Each is considered either "high" or "low" priority.  Tyler Castro: Alarms that are "high priority" mean that your pump has stopped, and you'll need to take action immediately. You'll see a red caution symbol on your pump display screen for a high priority alarm. You'll also hear a sound. Press "OK" on your pump to confirm you heard the alarm, and the sound will stop.  Then, refer to your Pump Patient Instructions for Use to see how to fix the problem.  SUPER: High priority = take action now  Tyler Castro: Alarms that are "low priority" mean that you need to do something soon to prevent a "high priority" alarm later on. You'll see a yellow caution symbol on your pump display screen for a low priority alarm. Each alarm is given for a particular reason and has its own unique sound.  SUPER: High priority = take action now  Low priority = there may be a problem soon  Tyler Castro: For a full list that describes what each alarm means and what you should do when they pop up, look at your pump's Instructions for Use. Don't interact with your delivery system while you're operating motorized vehicles or machinery, or otherwise engaging in activities where distractions need to be  avoided.  SUPER: Find a full list of pump alarms and messages in your VYALEV Pump Patient Instructions for Use.  Tyler Castro finished! That was a lot to learn. You can practice with your Nurse Ambassador until you feel prepared to do this at home.  SUPER: Practice with your Nurse Ambassador until you feel prepared  Tyler Castro: When challenges pop up, your [employer] Complete dedicated support will be there to help you get back on track. Your Nurse Ambassador is happy to answer any questions as you go along.  Tyler Castro: You can call the VY[employer] Complete 24/7 Hotline any time, even when your Nurse Ambassador is not available. The Hotline is there whenever you need it.  SUPER: Call the VY[employer] Complete 24/7 Hotline at [phone] The 24/7 Hotline is staffed by nurses that are specifically trained on VYALEV and can be reached 24 hours per day, 7 days a week. After business hours, you may be asked to leave a message for a return call within 30 minutes.  Tyler Castro: Remember, you can rely on [employer] [facility]. We're always here to help.  SUPER: [employer] Complete 24/7 support

## 2024-10-21 NOTE — Progress Notes (Addendum)
 Assessment/Plan:   1.  Parkinsons disease, diagnosed 2015, with symptoms several years prior             -Given the number of years that patient has had this diagnosis, he looks quite well.  He and I discussed this.             -- Loading dose: 1.60 mL on day 1 of implementation and if off infusion for > 3 hours .  Loading dose given today in office. - Base rate: 0.46 mL/h (1876 mg levodopa/day).  Pt at base rate when he left the office - Low rate: 0.41 mL/h  - High rate: 0.51 mL/h  - Extra dose:: 0.25 mL (42.5 mg levodopa)  The following were discussed with the patient:  - Administer in the abdomen, avoiding a 5-cm radius area from the navel. - Rotate the infusion site and use a new infusion set daily - Administer using the patient own Vy[employer]  pump and consumables provided by the company - The medication vials are for single use only. - Discard the syringe and any unused med in the syringe after it has been in the syringe for 24 hours.              - Patient participated in the Parkinsons Disease GeneRation study and was neg for the genes tested.             -We discussed the importance of treating his mental and physical health.  I think he is doing great with his physical health and his exercise, but encouraged him to remember his mental health as well, especially now that he has retired.  We did discuss counseling.  We discussed that 75% of patients with Parkinson's will need medication across their lifetime for mood.   2.  Bradycardia             -Patient under the management of cardiology and cardiac monitor just with known brady, although was not bradycardic in the office today.   Subjective:   Tyler Castro was seen today in follow up for vyalev start.  He feels incredibly slow.  He last took his immediate release dose about 3 AM, but has not taken the 7 AM dose.  He is having trouble moving.  Wife present today and supplements history.  Current prescribed movement  disorder medications: Carbidopa/levodopa 25/100, 2 tablets 5 times per day (7am/10am/1pm/5pm/10pm) Carbidopa/levodopa 25/100 CR, 1 tablet 5 times per day Clonazepam  0.5 mg, 1.5 tablets at bedtime    PREVIOUS MEDICATIONS: carbidopa/levodopa; entacapone (stomach cramps); pramipexole (stomach cramps at 0.25 mg 3 times per day); rotigotine was given but not taken due to cost; opicapone (prescribed but not taken as patient was worried about side effects); selegiline (stomach cramps); amantadine; Inbrija (cough)   ALLERGIES:   Allergies  Allergen Reactions   Atorvastatin Calcium      Other Reaction(s): myalgia; parkinsons problems (12/2020)   Rosuvastatin      Other Reaction(s): myalgia; parkinson problems   Sulfa Antibiotics     Liver function abnormal    CURRENT MEDICATIONS:  Current Meds  Medication Sig   acetaminophen  (TYLENOL ) 325 MG tablet Take 325 mg by mouth every 4 (four) hours as needed.   Alirocumab  (PRALUENT ) 150 MG/ML SOAJ Inject 1 mL (150 mg total) into the skin every 14 (fourteen) days. Please apply healthwell grant and deliver   clonazePAM  (KLONOPIN ) 0.5 MG tablet TAKE 1 AND 1/2 TABLETS(0.75 MG) BY MOUTH AT BEDTIME   [DISCONTINUED] carbidopa-levodopa (SINEMET  IR) 25-100 MG tablet Take 1 and 1/2 tablets by mouth 4 times per day   [DISCONTINUED] Carbidopa-Levodopa ER (SINEMET CR) 25-100 MG tablet controlled release Take 1 tablet by mouth 5 (five) times daily.     Objective:   PHYSICAL EXAMINATION:    VITALS:   Vitals:   10/22/24 0759  BP: 120/70  Pulse: 89  Resp: 20  SpO2: 98%  Weight: 210 lb (95.3 kg)  Height: 6' 3 (1.905 m)     GEN:  The patient appears stated age and is in NAD. HEENT:  Normocephalic, atraumatic.    Neurological examination:  Orientation: The patient is alert and oriented x3. Cranial nerves: There is good facial symmetry with significant facial hypomimia. The speech is fluent and clear. Soft palate rises symmetrically and there is no tongue  deviation. Hearing is intact to conversational tone. Sensation: Sensation is intact to light touch throughout Motor: Strength is at least antigravity x4.   Movement examination: Tone: There is mod increased tone in the RUE.  Tone elsewhere is nl Abnormal movements: no tremor or dyskinesia today Gait and Station: The patient ambulates very slow into the office with frequent freezing and short steps.  Following Vyalev start, but only about 10 minutes after the loading dose, patient was able to arise out of the chair with much improved steps, but he was still fairly short stepped, less freezing.    Total time spent on today's visit was 120 minutes, including both face-to-face time and nonface-to-face time.  Time included that spent on review of records (prior notes available to me/labs/imaging if pertinent), discussing treatment and goals, answering patient's questions and coordinating care.  Cc:  Auston Opal, DO

## 2024-10-22 ENCOUNTER — Telehealth: Payer: Self-pay

## 2024-10-22 ENCOUNTER — Other Ambulatory Visit: Payer: Self-pay

## 2024-10-22 ENCOUNTER — Encounter: Payer: Self-pay | Admitting: Neurology

## 2024-10-22 ENCOUNTER — Ambulatory Visit (INDEPENDENT_AMBULATORY_CARE_PROVIDER_SITE_OTHER): Admitting: Neurology

## 2024-10-22 ENCOUNTER — Other Ambulatory Visit (HOSPITAL_COMMUNITY): Payer: Self-pay

## 2024-10-22 VITALS — BP 120/70 | HR 89 | Resp 20 | Ht 75.0 in | Wt 210.0 lb

## 2024-10-22 DIAGNOSIS — G20B2 Parkinson's disease with dyskinesia, with fluctuations: Secondary | ICD-10-CM | POA: Diagnosis not present

## 2024-10-22 NOTE — Telephone Encounter (Signed)
 Error

## 2024-10-25 ENCOUNTER — Encounter: Payer: Self-pay | Admitting: Neurology

## 2024-10-28 NOTE — Progress Notes (Unsigned)
 Assessment/Plan:   1.  Parkinsons disease, diagnosed 2015, with symptoms several years prior             -Given the number of years that patient has had this diagnosis, he looks quite well.  He and I discussed this.             -- Vyalev pump dosages changed - description on procedure note.  Given wider dose range.    - Refer to physical therapy  - Reminded him that it can take a little time to get the right dose and adjust the pump but overall I do think that he is heading in the right direction   2.  Bradycardia             -Patient under the management of cardiology and cardiac monitor just with known brady, although was not bradycardic in the office today or last visit.   Subjective:   Tyler Castro was seen today in follow up.  He was seen a week ago for Vyalev start.  Patient emailed a few days later stating that he was no longer having freezing or stutter steps.  His sleep was much improved but he was experiencing leg cramps.  He felt his joints were unstable, as if each joint is trying to find a balance point independently, without coordination of the joints.  This was true even when the patient was on the high dose.  I thought perhaps the patient had gotten used to rigidity and was actually relying on that to have good balance.  I recommended physical therapy, and actually told him to go to the low-dose to see how he did.  He declined the physical therapy, but did go to the low-dose.  He returns today.  He reports that he has been on the high dose dose since tues and no stutter steps but more cramping on the high dose.  He reports in body pain for a few hours in the afternoon.  He feels like a scarecrow on the highdose.  Sleep is better on the high dose.  He states that he used to feel like tin man but is now like the  scarecrow.  Levodopa dosage prior to [facility]:: Carbidopa/levodopa 25/100, 2 tablets 5 times per day (7am/10am/1pm/5pm/10pm) Carbidopa/levodopa 25/100 CR, 1  tablet 5 times per day     PREVIOUS MEDICATIONS: carbidopa/levodopa; entacapone (stomach cramps); pramipexole (stomach cramps at 0.25 mg 3 times per day); rotigotine was given but not taken due to cost; opicapone (prescribed but not taken as patient was worried about side effects); selegiline (stomach cramps); amantadine; Inbrija (cough)   ALLERGIES:   Allergies  Allergen Reactions   Atorvastatin Calcium      Other Reaction(s): myalgia; parkinsons problems (12/2020)   Rosuvastatin      Other Reaction(s): myalgia; parkinson problems   Sulfa Antibiotics     Liver function abnormal    CURRENT MEDICATIONS:  No outpatient medications have been marked as taking for the 10/29/24 encounter (Office Visit) with Tyler Castro, Tyler RAMAN, DO.     Objective:   PHYSICAL EXAMINATION:    VITALS:   Vitals:   10/29/24 1108  BP: 136/82  Pulse: 74  SpO2: 98%  Weight: 210 lb (95.3 kg)  Height: 6' 3 (1.905 m)   GEN:  The patient appears stated age and is in NAD. HEENT:  Normocephalic, atraumatic.    Neurological examination:  Orientation: The patient is alert and oriented x3. Cranial nerves: There is good facial symmetry with  significant facial hypomimia. The speech is fluent and clear. Soft palate rises symmetrically and there is no tongue deviation. Hearing is intact to conversational tone. Sensation: Sensation is intact to light touch throughout Motor: Strength is at least antigravity x4.   Movement examination: Tone: There is mild increased tone in the right upper extremity (improved) Abnormal movements: no tremor or dyskinesia today Gait and Station: The patient pushes off to arise.  Once he is standing, he feels that his feet and legs are cramping and has to stand there for a few seconds.  He then is able to ambulate in the hallway.  He is somewhat tenuous, but actually walks fairly well in the hall, without freezing or short steps.   Total time spent on today's visit was 70 minutes,  including both face-to-face time and nonface-to-face time.  Time included that spent on review of records (prior notes available to me/labs/imaging if pertinent), discussing treatment and goals, answering patient's questions and coordinating care.  Cc:  Tyler Opal, DO

## 2024-10-29 ENCOUNTER — Ambulatory Visit (INDEPENDENT_AMBULATORY_CARE_PROVIDER_SITE_OTHER): Admitting: Neurology

## 2024-10-29 VITALS — BP 136/82 | HR 74 | Ht 75.0 in | Wt 210.0 lb

## 2024-10-29 DIAGNOSIS — G20B2 Parkinson's disease with dyskinesia, with fluctuations: Secondary | ICD-10-CM

## 2024-10-29 NOTE — Procedures (Signed)
 Vyalev dosing/administration settings upon leaving:              -- Loading dose: 1.60 mL on day 1 of implementation and if off infusion for > 3 hours .   - Base rate: 0.51 mL/h (2080 mg levodopa/day).  Pt at base rate when he left the office - Low rate: 0.39 mL/h  - High rate: 0.54 mL/h (2203 mg levodopa per day) - Extra dose:: 0.25 mL

## 2024-11-05 NOTE — Therapy (Signed)
 OUTPATIENT PHYSICAL THERAPY NEURO EVALUATION   Patient Name: Tyler Castro MRN: [id number] DOB:September 09, 1956, 68 y.o., male Today's Date: 11/08/2024   PCP: Auston Opal, DO  REFERRING PROVIDER: Tat, Asberry RAMAN, DO  END OF SESSION:  PT End of Session - 11/08/24 1014     Visit Number 1    Number of Visits 7    Date for Recertification  12/24/24    Authorization Type Medicare    PT Start Time 0932    PT Stop Time 1012    PT Time Calculation (min) 40 min    Activity Tolerance Patient tolerated treatment well;Other (comment)   c/o dizziness upon standing up from chair at end of session   Behavior During Therapy [facility] for tasks assessed/performed          Past Medical History:  Diagnosis Date   Arthritis    shoulder   BPH (benign prostatic hyperplasia)    History of hepatitis 12/17/2003   chemical sulfa   Seasonal allergies    Past Surgical History:  Procedure Laterality Date   KNEE SURGERY Right 2002   LEFT HEART CATHETERIZATION WITH CORONARY ANGIOGRAM N/A 08/19/2013   Procedure: LEFT HEART CATHETERIZATION WITH CORONARY ANGIOGRAM;  Surgeon: Victory LELON Claudene DOUGLAS, MD;  Location: [facility] CATH LAB;  Service: Cardiovascular;  Laterality: N/A;   MENISCUS REPAIR Left 2002   PROSTATECTOMY  2011   SHOULDER SURGERY Right 1980   Patient Active Problem List   Diagnosis Date Noted   Parkinson's disease (HCC) 03/27/2015   CAD (coronary artery disease), native coronary artery 01/24/2014   PVC (premature ventricular contraction) 01/24/2014   Hyperlipidemia 01/24/2014   DOE (dyspnea on exertion) 09/24/2013   Exertional angina 08/19/2013    Class: Chronic    ONSET DATE: 10+ years   REFERRING DIAG: G20.B2 (ICD-10-CM) - Parkinson's disease with dyskinesia and fluctuating manifestations (HCC)  THERAPY DIAG:  Cramp and spasm  Other abnormalities of gait and mobility  Other symptoms and signs involving the nervous system  Rationale for Evaluation and Treatment:  Rehabilitation  SUBJECTIVE:                                                                                                                                                                                             SUBJECTIVE STATEMENT: Patient reports that he was diagnosed with PD 10 years ago, but feels that he was likely showing symptoms before that. Reports cramping in R>L feet, pain and difficulty moving his legs. Reports slight tremor and handwriting has gotten smaller. Reports some dizzy spells- can occur when he is sitting and unsure what is triggering it. Reports some periods  where he is having involuntary movements while brushing his teeth. Currently walking 6x week and [employer] for weight training as well as bike 2-3x/week. Reports occasional periods of freezing or festinating- worse after standing in place for a while or taking the first step.    Pt accompanied by: self  PERTINENT HISTORY: R knee surgery, R shoulder surgery  PAIN:  Are you having pain? Yes: NPRS scale: 7/10 Pain location: B LEs Pain description: cramping Aggravating factors: not known Relieving factors: moving  PRECAUTIONS: Fall; pt has continuous infusion of Levodopa  through abdomen   RED FLAGS: None   WEIGHT BEARING RESTRICTIONS: No  FALLS: Has patient fallen in last 6 months? No  LIVING ENVIRONMENT: Lives with: lives with their spouse Lives in: House/apartment Stairs: 3-6 steps to enter with handrail in the back; 2 story home  Has following equipment at home: walking stick  PLOF: Independent, Vocation/Vocational requirements: retired, and Leisure: reading, fixing things around the house  PATIENT GOALS: reducing pain and getting more control over my muscles to move more freely   OBJECTIVE:  Note: Objective measures were completed at Evaluation unless otherwise noted.  DIAGNOSTIC FINDINGS: none recent  COGNITION: Overall cognitive status: Within functional limits for tasks  assessed   SENSATION: Pt reports occasional N/T in feet  COORDINATION: Alternating pronation/supination: intact B Alternating toe tap: pt reports difficulty increasing speed Finger to nose: intact B    MUSCLE TONE: marked R LE rigidity   POSTURE: rounded shoulders  LOWER EXTREMITY ROM:     Active  Right Eval Left Eval  Hip flexion    Hip extension    Hip abduction    Hip adduction    Hip internal rotation    Hip external rotation    Knee flexion    Knee extension    Ankle dorsiflexion 12 18  Ankle plantarflexion    Ankle inversion    Ankle eversion     (Blank rows = not tested)  LOWER EXTREMITY MMT:    MMT Right Eval Left Eval  Hip flexion 4+ 4+  Hip extension    Hip abduction 4+ 4+  Hip adduction 4+ 4+  Hip internal rotation    Hip external rotation    Knee flexion 5 4  Knee extension 4+ 5  Ankle dorsiflexion 4+ *c/o cramping 4+ c/o cramping  Ankle plantarflexion 4-/5 *in standing  3/5  *in standing  Ankle inversion    Ankle eversion    (Blank rows = not tested)  GAIT: Findings: Assistive device utilized:None, Level of assistance: Modified independence, and Comments: good long stride with straight ahead walking but festinating/freezing evident with turns   FUNCTIONAL TESTS:  : 10.25 sec (3.2 ft/sec) *pt reported lightheadedness upon standing up from exam room which was addressed with water break  TREATMENT DATE: 11/08/24    PATIENT EDUCATION: Education details: prognosis, POC, edu on benefits of OT for pt's concerns- he reports he would like to hold off for now Person educated: Patient Education method: Explanation, Demonstration, Tactile cues, Verbal cues, and Handouts Education comprehension: verbalized understanding and returned demonstration  HOME EXERCISE PROGRAM: Access Code: [email or web address] URL:  [email or web address] Date: 11/08/2024 Prepared by: [facility] Outpatient  Rehab - [facility]  Exercises - Gastroc Stretch with Foot at Wall  - 1 x daily - 5 x weekly - 2 sets - 30 sec  hold - Seated Hamstring Stretch  - 1 x daily - 5 x weekly - 2 sets - 30 sec hold - Single Leg Heel Raise with Counter Support  - 1 x daily - 5 x weekly - 2 sets - 10 reps  GOALS: Goals reviewed with patient? Yes  SHORT TERM GOALS: Target date: 11/29/2024  Patient to be independent with initial HEP. Baseline: HEP initiated Goal status: INITIAL    LONG TERM GOALS: Target date: 12/24/2024  Patient to be independent with advanced HEP. Baseline: Not yet initiated  Goal status: INITIAL  Patient to improve MiniBestTest score to atleast 17-21 to decrease risk of falls.  Baseline: NT Goal status: INITIAL  Patient to report compliance with stretching regimen to manage LE rigidity.  Baseline: not performing  Goal status: INITIAL  Patient to report 50% improvement in dizziness.    Baseline: - Goal status: INITIAL  Patient to report 50% improvement in LE pain/cramping.  Baseline: - Goal status: INITIAL  Patient to verbalize tips to reduce freezing/festination with gait and turns. Baseline: Not yet initiated  Goal status: INITIAL   ASSESSMENT:  CLINICAL IMPRESSION:  Patient is a 68 y/o M with Parkinson's Disease presenting to OPPT with c/o LE pain and cramping, dizziness, and movement deviations. Patient today presenting with Decreased LE coordination, mild knee and ankle weakness, c/o foot cramping with MMT, festinating/freezing with turns. Patient was educated on gentle stretching and strengthening HEP and reported understanding. Would benefit from skilled PT services 1 x/week for 6 weeks to address aforementioned impairments in order to optimize level of function.    OBJECTIVE IMPAIRMENTS: Abnormal gait, decreased activity tolerance, decreased balance, decreased  coordination, difficulty walking, decreased ROM, decreased strength, dizziness, increased muscle spasms, impaired flexibility, postural dysfunction, and pain.   ACTIVITY LIMITATIONS: carrying, lifting, bending, standing, squatting, stairs, transfers, bathing, dressing, hygiene/grooming, and locomotion level  PARTICIPATION LIMITATIONS: meal prep, cleaning, laundry, shopping, community activity, yard work, and church  PERSONAL FACTORS: Age, Fitness, Past/current experiences, Time since onset of injury/illness/exacerbation, and 1-2 comorbidities: R knee surgery, R shoulder surgery,  are also affecting patient's functional outcome.   REHAB POTENTIAL: Good  CLINICAL DECISION MAKING: Evolving/moderate complexity  EVALUATION COMPLEXITY: Moderate  PLAN:  PT FREQUENCY: 1x/week  PT DURATION: 6 weeks  PLANNED INTERVENTIONS: 97164- PT Re-evaluation, 97110-Therapeutic exercises, 97530- Therapeutic activity, 97112- Neuromuscular re-education, 97535- Self Care, [id number]- Manual therapy, [phone]- Gait training, [phone]- Canalith repositioning, [id number]- Aquatic Therapy, [phone]- Electrical stimulation (manual), [phone] (1-2 muscles), [phone] (3+ muscles)- Dry Needling, Patient/Family education, Balance training, Stair training, Taping, Joint mobilization, Spinal mobilization, Vestibular training, Cryotherapy, and Moist heat  PLAN FOR NEXT SESSION: Mini Best Test, check orthostatics     Louana Terrilyn Christians, PT, DPT 11/08/24 11:09 AM  [facility] at [facility] [address] [place], [place] [address] Phone # [phone] Fax # [phone]

## 2024-11-08 ENCOUNTER — Other Ambulatory Visit: Payer: Self-pay

## 2024-11-08 ENCOUNTER — Encounter: Payer: Self-pay | Admitting: Physical Therapy

## 2024-11-08 ENCOUNTER — Ambulatory Visit: Attending: Neurology | Admitting: Physical Therapy

## 2024-11-08 DIAGNOSIS — R29818 Other symptoms and signs involving the nervous system: Secondary | ICD-10-CM | POA: Insufficient documentation

## 2024-11-08 DIAGNOSIS — R252 Cramp and spasm: Secondary | ICD-10-CM | POA: Diagnosis present

## 2024-11-08 DIAGNOSIS — R2689 Other abnormalities of gait and mobility: Secondary | ICD-10-CM | POA: Diagnosis present

## 2024-11-08 DIAGNOSIS — G20B2 Parkinson's disease with dyskinesia, with fluctuations: Secondary | ICD-10-CM | POA: Insufficient documentation

## 2024-11-09 ENCOUNTER — Telehealth: Payer: Self-pay | Admitting: Neurology

## 2024-11-09 NOTE — Progress Notes (Signed)
 Assessment/Plan:   1.  Parkinsons disease, diagnosed 2015, with symptoms several years prior             -Given the number of years that patient has had this diagnosis, he looks quite well.  He and I discussed this.  -getting ready to start PT             -- Vyalev pump dosages changed, but did tell him that he needed to leave it on a specific rate for several days to trial it out, rather than several hours.  - Discussed pump dosages, and that it could probably take longer than 3 hours for us  to know if he has the right rate before changing back. Vyalev dosing/administration settings upon leaving:              -- Loading dose: 1.60 mL on day 1 of implementation and if off infusion for > 3 hours .   - Base rate: 0.54 mL/h Pt at base rate when he left the office - Low rate: 0.51 mL/h  - High rate: 0.56 mL/h  - Extra dose:: 0.25 mL (lockout at 8 hours) using extra dose 1-2 times per day   2.  Bradycardia             -Patient under the management of cardiology and cardiac monitor just with known brady, although was not bradycardic in the office today or last visits  3.  Mood d/o  -discussed stats that 75% of patients need medication for Parkinsons Disease related mood d/o.  Encouraged counseling as well.  He had a counselor years ago and is going to see if she is still in business and will let me know.   Subjective:   Tyler Castro was seen today in follow up.  He was seen 2 weeks ago for Vyalev initiation and 1 week ago to adjust his pump.  He finds that on the low settings he has cramping in the feet and legs and it just is not doable.  He then went to the basal rate and he noticed some mild improvement, but he continued to have cramping.  He then tried the higher rate, but it caused pain in the legs and strong foot cramps and he went back to the base rate after 3 hours.  I did refer him to physical therapy after last visit.  He has just had his initial evaluation.  He thinks he never  has an on period.  He sleeps to compensate.   Wife notes that sleep has improved, although not entirely.  She notes less freezing and more endurance but wife notes that he is more uncomfortable.  Pt reports he feels less steady on his feet, like he is a marrionette.    Levodopa  dosage prior to [facility]:: Carbidopa /levodopa  25/100, 2 tablets 5 times per day (7am/10am/1pm/5pm/10pm) Carbidopa /levodopa  25/100 CR, 1 tablet 5 times per day     PREVIOUS MEDICATIONS: carbidopa /levodopa ; entacapone (stomach cramps); pramipexole (stomach cramps at 0.25 mg 3 times per day); rotigotine was given but not taken due to cost; opicapone (prescribed but not taken as patient was worried about side effects); selegiline (stomach cramps); amantadine; Inbrija  (cough)   ALLERGIES:   Allergies  Allergen Reactions   Atorvastatin Calcium      Other Reaction(s): myalgia; parkinsons problems (12/2020)   Rosuvastatin      Other Reaction(s): myalgia; parkinson problems   Sulfa Antibiotics     Liver function abnormal    CURRENT MEDICATIONS:  Current Meds  Medication Sig   acetaminophen  (TYLENOL ) 325 MG tablet Take 325 mg by mouth every 4 (four) hours as needed.   Alirocumab  (PRALUENT ) 150 MG/ML SOAJ Inject 1 mL (150 mg total) into the skin every 14 (fourteen) days. Please apply [employer] grant and deliver   clonazePAM  (KLONOPIN ) 0.5 MG tablet TAKE 1 AND 1/2 TABLETS(0.75 MG) BY MOUTH AT BEDTIME   Foscarbidopa-Foslevodopa (VYALEV) 12-240 MG/ML SOLN Inject 0.46 mLs into the skin continuous. Medication: Vyalev  Directions: For infusion through [facility] Pump only.  Infuse SQ continuously using the Vyafuser pump Low base rate .41 mL/hr High base rate .51 mL/hr Loading dose 1.60 mL, Lockout (hr) 3 Extra dose .25 mL, Lockout (hr) 3 Lock level: 3 Dispense:  28  Vials for a  30   day supply Braun 10 mL Syringe : Change syringe at least every 24 hours as indicated on pump. Dispense quantity equal to number of Vyalev  vials Vented Vial adapter : Use one per vial . Dispense quantity equal to number of Vyalev vials Neria Guard Infusion set : Use 9 mm infusion set to infuse Vyalev. Change infusion set at least every 24 hours or if prolonged interruption of therapy lasting more than 1 hour. Dispense 30  Refills for all medications and supplies : 12     Objective:   PHYSICAL EXAMINATION:    VITALS:   Vitals:   11/15/24 1043  BP: 126/74  Pulse: 82  SpO2: 98%  Weight: 210 lb (95.3 kg)  Height: 6' 3 (1.905 m)    GEN:  The patient appears stated age and is in NAD. HEENT:  Normocephalic, atraumatic.   CV:  RRR Lungs:  CTAB  Neurological examination:  Orientation: The patient is alert and oriented x3. Cranial nerves: There is good facial symmetry with significant facial hypomimia. The speech is fluent and clear. Soft palate rises symmetrically and there is no tongue deviation. Hearing is intact to conversational tone. Sensation: Sensation is intact to light touch throughout Motor: Strength is at least antigravity x4.   Movement examination: Tone: There is mild increased tone in the right upper extremity (similar to last visit) Abnormal movements: no tremor or dyskinesia today Gait and Station: The patient is able to arise without use of hands.  He ambulates well with decreased arm swing on the R   Total time spent on today's visit was 50 minutes, including both face-to-face time and nonface-to-face time.  Time included that spent on review of records (prior notes available to me/labs/imaging if pertinent), discussing treatment and goals, answering patient's questions and coordinating care.  Cc:  Auston Opal, DO

## 2024-11-09 NOTE — Telephone Encounter (Signed)
 [facility])  at [phone] ext. [phone], called and states that pt quoted the   [facility] rates different so Bruna need the rates to be faxed to them and fax number is [phone]

## 2024-11-10 NOTE — Telephone Encounter (Signed)
 Faxed notes to [facility]

## 2024-11-15 ENCOUNTER — Telehealth: Payer: Self-pay | Admitting: Neurology

## 2024-11-15 ENCOUNTER — Ambulatory Visit (INDEPENDENT_AMBULATORY_CARE_PROVIDER_SITE_OTHER): Admitting: Neurology

## 2024-11-15 VITALS — BP 126/74 | HR 82 | Ht 75.0 in | Wt 210.0 lb

## 2024-11-15 DIAGNOSIS — G20B2 Parkinson's disease with dyskinesia, with fluctuations: Secondary | ICD-10-CM | POA: Diagnosis not present

## 2024-11-15 DIAGNOSIS — F39 Unspecified mood [affective] disorder: Secondary | ICD-10-CM

## 2024-11-15 NOTE — Telephone Encounter (Signed)
 Pat with [facility] called and LM with AN. Hse needs an updated RX for pt Vyaled. They are sending pt more than he is using. Please send an updated RX with dosage rates.  Fax [phone] They need a hard copy for medicare

## 2024-11-18 NOTE — Therapy (Incomplete)
 OUTPATIENT PHYSICAL THERAPY NEURO TREATMENT   Patient Name: Tyler Castro MRN: [id number] DOB:1956/05/26, 68 y.o., male Today's Date: 11/18/2024   PCP: Auston Opal, DO  REFERRING PROVIDER: Tat, Asberry RAMAN, DO  END OF SESSION:    Past Medical History:  Diagnosis Date   Arthritis    shoulder   BPH (benign prostatic hyperplasia)    History of hepatitis 12/17/2003   chemical sulfa   Seasonal allergies    Past Surgical History:  Procedure Laterality Date   KNEE SURGERY Right 2002   LEFT HEART CATHETERIZATION WITH CORONARY ANGIOGRAM N/A 08/19/2013   Procedure: LEFT HEART CATHETERIZATION WITH CORONARY ANGIOGRAM;  Surgeon: Victory LELON Claudene DOUGLAS, MD;  Location: [facility] CATH LAB;  Service: Cardiovascular;  Laterality: N/A;   MENISCUS REPAIR Left 2002   PROSTATECTOMY  2011   SHOULDER SURGERY Right 1980   Patient Active Problem List   Diagnosis Date Noted   Parkinson's disease (HCC) 03/27/2015   CAD (coronary artery disease), native coronary artery 01/24/2014   PVC (premature ventricular contraction) 01/24/2014   Hyperlipidemia 01/24/2014   DOE (dyspnea on exertion) 09/24/2013   Exertional angina 08/19/2013    Class: Chronic    ONSET DATE: 10+ years   REFERRING DIAG: G20.B2 (ICD-10-CM) - Parkinson'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  Rationale for Evaluation and Treatment: Rehabilitation  SUBJECTIVE:                                                                                                                                                                                             SUBJECTIVE STATEMENT: Patient reports that he was diagnosed with PD 10 years ago, but feels that he was likely showing symptoms before that. Reports cramping in R>L feet, pain and difficulty moving his legs. Reports slight tremor and handwriting has gotten smaller. Reports some dizzy spells- can occur when he is sitting and unsure what is  triggering it. Reports some periods where he is having involuntary movements while brushing his teeth. Currently walking 6x week and [employer] for weight training as well as bike 2-3x/week. Reports occasional periods of freezing or festinating- worse after standing in place for a while or taking the first step.    Pt accompanied by: self  PERTINENT HISTORY: R knee surgery, R shoulder surgery  PAIN:  Are you having pain? Yes: NPRS scale: 7/10 Pain location: B LEs Pain description: cramping Aggravating factors: not known Relieving factors: moving  PRECAUTIONS: Fall; pt has continuous infusion of Levodopa  through abdomen   RED FLAGS: None   WEIGHT BEARING RESTRICTIONS: No  FALLS: Has patient fallen in  last 6 months? No  LIVING ENVIRONMENT: Lives with: lives with their spouse Lives in: House/apartment Stairs: 3-6 steps to enter with handrail in the back; 2 story home  Has following equipment at home: walking stick  PLOF: Independent, Vocation/Vocational requirements: retired, and Leisure: reading, fixing things around the house  PATIENT GOALS: reducing pain and getting more control over my muscles to move more freely   OBJECTIVE:      TODAY'S TREATMENT: 11/19/24 Activity Comments                          Note: Objective measures were completed at Evaluation unless otherwise noted.  DIAGNOSTIC FINDINGS: none recent  COGNITION: Overall cognitive status: Within functional limits for tasks assessed   SENSATION: Pt reports occasional N/T in feet  COORDINATION: Alternating pronation/supination: intact B Alternating toe tap: pt reports difficulty increasing speed Finger to nose: intact B    MUSCLE TONE: marked R LE rigidity   POSTURE: rounded shoulders  LOWER EXTREMITY ROM:     Active  Right Eval Left Eval  Hip flexion    Hip extension    Hip abduction    Hip adduction    Hip internal rotation    Hip external rotation    Knee flexion    Knee  extension    Ankle dorsiflexion 12 18  Ankle plantarflexion    Ankle inversion    Ankle eversion     (Blank rows = not tested)  LOWER EXTREMITY MMT:    MMT Right Eval Left Eval  Hip flexion 4+ 4+  Hip extension    Hip abduction 4+ 4+  Hip adduction 4+ 4+  Hip internal rotation    Hip external rotation    Knee flexion 5 4  Knee extension 4+ 5  Ankle dorsiflexion 4+ *c/o cramping 4+ c/o cramping  Ankle plantarflexion 4-/5 *in standing  3/5  *in standing  Ankle inversion    Ankle eversion    (Blank rows = not tested)  GAIT: Findings: Assistive device utilized:None, Level of assistance: Modified independence, and Comments: good long stride with straight ahead walking but festinating/freezing evident with turns   FUNCTIONAL TESTS:  : 10.25 sec (3.2 ft/sec) *pt reported lightheadedness upon standing up from exam room which was addressed with water break                                                                                                                               TREATMENT DATE: 11/08/24    PATIENT EDUCATION: Education details: prognosis, POC, edu on benefits of OT for pt's concerns- he reports he would like to hold off for now Person educated: Patient Education method: Explanation, Demonstration, Tactile cues, Verbal cues, and Handouts Education comprehension: verbalized understanding and returned demonstration  HOME EXERCISE PROGRAM: Access Code: [email or web address] URL: [email or web address] Date: 11/08/2024 Prepared by: [facility] - Outpatient  Rehab - [facility]  Exercises - Gastroc Stretch  with Foot at Wall  - 1 x daily - 5 x weekly - 2 sets - 30 sec  hold - Seated Hamstring Stretch  - 1 x daily - 5 x weekly - 2 sets - 30 sec hold - Single Leg Heel Raise with Counter Support  - 1 x daily - 5 x weekly - 2 sets - 10 reps  GOALS: Goals reviewed with patient? Yes  SHORT TERM GOALS: Target date: 11/29/2024  Patient to be independent with  initial HEP. Baseline: HEP initiated Goal status: IN PROGRESS    LONG TERM GOALS: Target date: 12/24/2024  Patient to be independent with advanced HEP. Baseline: Not yet initiated  Goal status: IN PROGRESS  Patient to improve MiniBestTest score to atleast 17-21 to decrease risk of falls.  Baseline: NT Goal status: IN PROGRESS  Patient to report compliance with stretching regimen to manage LE rigidity.  Baseline: not performing  Goal status: IN PROGRESS  Patient to report 50% improvement in dizziness.    Baseline: - Goal status: IN PROGRESS  Patient to report 50% improvement in LE pain/cramping.  Baseline: - Goal status: IN PROGRESS  Patient to verbalize tips to reduce freezing/festination with gait and turns. Baseline: Not yet initiated  Goal status: IN PROGRESS   ASSESSMENT:  CLINICAL IMPRESSION:  Patient is a 68 y/o M with Parkinson's Disease presenting to OPPT with c/o LE pain and cramping, dizziness, and movement deviations. Patient today presenting with Decreased LE coordination, mild knee and ankle weakness, c/o foot cramping with MMT, festinating/freezing with turns. Patient was educated on gentle stretching and strengthening HEP and reported understanding. Would benefit from skilled PT services 1 x/week for 6 weeks to address aforementioned impairments in order to optimize level of function.    OBJECTIVE IMPAIRMENTS: Abnormal gait, decreased activity tolerance, decreased balance, decreased coordination, difficulty walking, decreased ROM, decreased strength, dizziness, increased muscle spasms, impaired flexibility, postural dysfunction, and pain.   ACTIVITY LIMITATIONS: carrying, lifting, bending, standing, squatting, stairs, transfers, bathing, dressing, hygiene/grooming, and locomotion level  PARTICIPATION LIMITATIONS: meal prep, cleaning, laundry, shopping, community activity, yard work, and church  PERSONAL FACTORS: Age, Fitness, Past/current experiences, Time  since onset of injury/illness/exacerbation, and 1-2 comorbidities: R knee surgery, R shoulder surgery,  are also affecting patient's functional outcome.   REHAB POTENTIAL: Good  CLINICAL DECISION MAKING: Evolving/moderate complexity  EVALUATION COMPLEXITY: Moderate  PLAN:  PT FREQUENCY: 1x/week  PT DURATION: 6 weeks  PLANNED INTERVENTIONS: 97164- PT Re-evaluation, 97110-Therapeutic exercises, 97530- Therapeutic activity, 97112- Neuromuscular re-education, 97535- Self Care, [id number]- Manual therapy, [phone]- Gait training, [phone]- Canalith repositioning, [id number]- Aquatic Therapy, [phone]- Electrical stimulation (manual), [phone] (1-2 muscles), [id number] (3+ muscles)- Dry Needling, Patient/Family education, Balance training, Stair training, Taping, Joint mobilization, Spinal mobilization, Vestibular training, Cryotherapy, and Moist heat  PLAN FOR NEXT SESSION: Mini Best Test, check orthostatics     Louana Terrilyn Christians, PT, DPT 11/18/24 8:54 AM  [facility] at [facility] [address], [address] [place], [place] [address] Phone # [phone] Fax # [phone]

## 2024-11-19 ENCOUNTER — Encounter: Payer: Self-pay | Admitting: Neurology

## 2024-11-19 ENCOUNTER — Ambulatory Visit: Admitting: Physical Therapy

## 2024-11-19 DIAGNOSIS — G20B2 Parkinson's disease with dyskinesia, with fluctuations: Secondary | ICD-10-CM

## 2024-11-19 NOTE — Telephone Encounter (Signed)
 Pt's wife called-After Pt dosage chng Pt experience dizziness, sever foot cramps and increasing leg pain, pls call bk with next steps

## 2024-11-22 ENCOUNTER — Telehealth: Payer: Self-pay | Admitting: Neurology

## 2024-11-22 MED ORDER — CARBIDOPA-LEVODOPA ER 25-100 MG PO TBCR: 2.0000 | EXTENDED_RELEASE_TABLET | Freq: Every day | ORAL | 1 refills | Status: AC

## 2024-11-22 MED ORDER — CARBIDOPA-LEVODOPA 25-100 MG PO TABS: ORAL_TABLET | ORAL | 1 refills | Status: AC

## 2024-11-22 NOTE — Telephone Encounter (Signed)
 [facility] [employer] Call ID: [id number]  Caller: Leita Locus  [facility]: [phone]  Reason: Caller states she left a message for office to call back. Attn Chelsea or Dr. Evonnie.

## 2024-11-22 NOTE — Telephone Encounter (Signed)
 Please see the MyChart message reply(ies) for my assessment and plan.    This patient gave consent for this Medical Advice Message and is aware that it may result in a bill to [employer], as well as the possibility of receiving a bill for a co-payment or deductible. They are an established patient, but are not seeking medical advice exclusively about a problem treated during an in person or video visit in the last seven days. I did not recommend an in person or video visit within seven days of my reply.    I spent a total of 15 minutes cumulative time within 7 days through [employer].  Asberry Schneider, DO

## 2024-11-22 NOTE — Telephone Encounter (Signed)
 Pt questions answered in my chart

## 2024-11-23 NOTE — Telephone Encounter (Signed)
 Called patient and let him know to stop pump and in 3 hours he can start his oral meds. I also let him know his CL oral meds have been sent to the pharmacy

## 2024-11-23 NOTE — Telephone Encounter (Signed)
 Responded to patient via phone call to let him know to stop pump for three hours before taking oral meds

## 2024-11-24 ENCOUNTER — Ambulatory Visit: Admitting: Neurology

## 2024-11-26 ENCOUNTER — Ambulatory Visit

## 2024-11-26 DIAGNOSIS — R2689 Other abnormalities of gait and mobility: Secondary | ICD-10-CM

## 2024-11-26 DIAGNOSIS — R252 Cramp and spasm: Secondary | ICD-10-CM | POA: Insufficient documentation

## 2024-11-26 DIAGNOSIS — R29818 Other symptoms and signs involving the nervous system: Secondary | ICD-10-CM

## 2024-11-26 NOTE — Therapy (Signed)
 OUTPATIENT PHYSICAL THERAPY NEURO TREATMENT   Patient Name: Tyler Castro MRN: [id number] DOB:01/26/1956, 68 y.o., male Today's Date: 11/26/2024   PCP: Auston Opal, DO  REFERRING PROVIDER: Tat, Asberry RAMAN, DO  END OF SESSION:  PT End of Session - 11/26/24 1004     Visit Number 2    Number of Visits 7    Date for Recertification  12/24/24    Authorization Type Medicare    PT Start Time 1015    PT Stop Time 1100    PT Time Calculation (min) 45 min    Activity Tolerance Patient tolerated treatment well;Other (comment)   c/o dizziness upon standing up from chair at end of session   Behavior During Therapy [facility] for tasks assessed/performed          Past Medical History:  Diagnosis Date   Arthritis    shoulder   BPH (benign prostatic hyperplasia)    History of hepatitis 12/17/2003   chemical sulfa   Seasonal allergies    Past Surgical History:  Procedure Laterality Date   KNEE SURGERY Right 2002   LEFT HEART CATHETERIZATION WITH CORONARY ANGIOGRAM N/A 08/19/2013   Procedure: LEFT HEART CATHETERIZATION WITH CORONARY ANGIOGRAM;  Surgeon: Victory LELON Claudene DOUGLAS, MD;  Location: [facility] CATH LAB;  Service: Cardiovascular;  Laterality: N/A;   MENISCUS REPAIR Left 2002   PROSTATECTOMY  2011   SHOULDER SURGERY Right 1980   Patient Active Problem List   Diagnosis Date Noted   Parkinson's disease (HCC) 03/27/2015   CAD (coronary artery disease), native coronary artery 01/24/2014   PVC (premature ventricular contraction) 01/24/2014   Hyperlipidemia 01/24/2014   DOE (dyspnea on exertion) 09/24/2013   Exertional angina 08/19/2013    Class: Chronic    ONSET DATE: 10+ years   REFERRING DIAG: G20.B2 (ICD-10-CM) - Parkinson's disease with dyskinesia and fluctuating manifestations (HCC)  THERAPY DIAG:  Cramp and spasm  Other abnormalities of gait and mobility  Other symptoms and signs involving the nervous system  Rationale for Evaluation and Treatment:  Rehabilitation  SUBJECTIVE:                                                                                                                                                                                             SUBJECTIVE STATEMENT: Was not doing well with the PD pump and have switched back to tablets for carbidopa .  Cramping R > L beginning in feet and ascending.     Pt accompanied by: self  PERTINENT HISTORY: R knee surgery, R shoulder surgery  PAIN:  Are you having pain? Yes: NPRS scale: 7/10 Pain location: B LEs Pain  description: cramping Aggravating factors: not known Relieving factors: moving  PRECAUTIONS: Fall; pt has continuous infusion of Levodopa  through abdomen   RED FLAGS: None   WEIGHT BEARING RESTRICTIONS: No  FALLS: Has patient fallen in last 6 months? No  LIVING ENVIRONMENT: Lives with: lives with their spouse Lives in: House/apartment Stairs: 3-6 steps to enter with handrail in the back; 2 story home  Has following equipment at home: walking stick  PLOF: Independent, Vocation/Vocational requirements: retired, and Leisure: reading, fixing things around the house  PATIENT GOALS: reducing pain and getting more control over my muscles to move more freely   OBJECTIVE:   TODAY'S TREATMENT: 11/26/24 Activity Comments  10 meter walk: 13.46 sec   Mini-BESTest 19/28  Pt education -Discussion regarding cue strategies for FoG -instruction and work sheet for assessing orthostatic hypotension             Note: Objective measures were completed at Evaluation unless otherwise noted.  DIAGNOSTIC FINDINGS: none recent  COGNITION: Overall cognitive status: Within functional limits for tasks assessed   SENSATION: Pt reports occasional N/T in feet  COORDINATION: Alternating pronation/supination: intact B Alternating toe tap: pt reports difficulty increasing speed Finger to nose: intact B    MUSCLE TONE: marked R LE rigidity   POSTURE: rounded  shoulders  LOWER EXTREMITY ROM:     Active  Right Eval Left Eval  Hip flexion    Hip extension    Hip abduction    Hip adduction    Hip internal rotation    Hip external rotation    Knee flexion    Knee extension    Ankle dorsiflexion 12 18  Ankle plantarflexion    Ankle inversion    Ankle eversion     (Blank rows = not tested)  LOWER EXTREMITY MMT:    MMT Right Eval Left Eval  Hip flexion 4+ 4+  Hip extension    Hip abduction 4+ 4+  Hip adduction 4+ 4+  Hip internal rotation    Hip external rotation    Knee flexion 5 4  Knee extension 4+ 5  Ankle dorsiflexion 4+ *c/o cramping 4+ c/o cramping  Ankle plantarflexion 4-/5 *in standing  3/5  *in standing  Ankle inversion    Ankle eversion    (Blank rows = not tested)  GAIT: Findings: Assistive device utilized:None, Level of assistance: Modified independence, and Comments: good long stride with straight ahead walking but festinating/freezing evident with turns   FUNCTIONAL TESTS:  : 10.25 sec (3.2 ft/sec) *pt reported lightheadedness upon standing up from exam room which was addressed with water break                                                                                                                               TREATMENT DATE: 11/08/24    PATIENT EDUCATION: Education details: prognosis, POC, edu on benefits of OT for pt's concerns- he reports he would like to hold off for  now Person educated: Patient Education method: Explanation, Demonstration, Tactile cues, Verbal cues, and Handouts Education comprehension: verbalized understanding and returned demonstration  HOME EXERCISE PROGRAM: Access Code: [email or web address] URL: [email or web address] Date: 11/08/2024 Prepared by: [facility] - Outpatient  Rehab - [facility]  Exercises - Gastroc Stretch with Foot at Wall  - 1 x daily - 5 x weekly - 2 sets - 30 sec  hold - Seated Hamstring Stretch  - 1 x daily - 5 x weekly - 2 sets - 30 sec  hold - Single Leg Heel Raise with Counter Support  - 1 x daily - 5 x weekly - 2 sets - 10 reps - Long Sitting Plantar Fascia Stretch with Towel  - 3 sets - 30-60 sec hold  GOALS: Goals reviewed with patient? Yes  SHORT TERM GOALS: Target date: 11/29/2024  Patient to be independent with initial HEP. Baseline: HEP initiated Goal status: INITIAL    LONG TERM GOALS: Target date: 12/24/2024  Patient to be independent with advanced HEP. Baseline: Not yet initiated  Goal status: INITIAL  Patient to improve MiniBestTest score to atleast 17-21 to decrease risk of falls.  Baseline: NT Goal status: INITIAL  Patient to report compliance with stretching regimen to manage LE rigidity.  Baseline: not performing  Goal status: INITIAL  Patient to report 50% improvement in dizziness.    Baseline: - Goal status: INITIAL  Patient to report 50% improvement in LE pain/cramping.  Baseline: - Goal status: INITIAL  Patient to verbalize tips to reduce freezing/festination with gait and turns. Baseline: Not yet initiated  Goal status: INITIAL   ASSESSMENT:  CLINICAL IMPRESSION:  Pt reports he was having adverse effect to recent medication change (pump) and has reverted to use of tablet form and notes it is not likely having full effect as his motor symptoms of PD have been worse this AM.  Mini-BESTest performed w/ score 19/28 indicating increased risk for falls.  Notably, he was having significant trouble with turning/pivoting today requiring 7-8 steps to negotiation 180 degree turns and unable to tolerate static multisensory balance.  Provided education regarding FoG strategies and provided compreshensive list of different cue-augmented strategies.  Continued sessions to progress POC details as able   OBJECTIVE IMPAIRMENTS: Abnormal gait, decreased activity tolerance, decreased balance, decreased coordination, difficulty walking, decreased ROM, decreased strength, dizziness, increased muscle  spasms, impaired flexibility, postural dysfunction, and pain.   ACTIVITY LIMITATIONS: carrying, lifting, bending, standing, squatting, stairs, transfers, bathing, dressing, hygiene/grooming, and locomotion level  PARTICIPATION LIMITATIONS: meal prep, cleaning, laundry, shopping, community activity, yard work, and church  PERSONAL FACTORS: Age, Fitness, Past/current experiences, Time since onset of injury/illness/exacerbation, and 1-2 comorbidities: R knee surgery, R shoulder surgery,  are also affecting patient's functional outcome.   REHAB POTENTIAL: Good  CLINICAL DECISION MAKING: Evolving/moderate complexity  EVALUATION COMPLEXITY: Moderate  PLAN:  PT FREQUENCY: 1x/week  PT DURATION: 6 weeks  PLANNED INTERVENTIONS: 97164- PT Re-evaluation, 97110-Therapeutic exercises, 97530- Therapeutic activity, [id number]- Neuromuscular re-education, 97535- Self Care, [id number]- Manual therapy, [id number]- Gait training, [phone]- Canalith repositioning, [id number]- Aquatic Therapy, [phone]- Electrical stimulation (manual), [phone] (1-2 muscles), 20561 (3+ muscles)- Dry Needling, Patient/Family education, Balance training, Stair training, Taping, Joint mobilization, Spinal mobilization, Vestibular training, Cryotherapy, and Moist heat  PLAN FOR NEXT SESSION: check orthostatics, HEP/balance activities   11:12 AM, 11/26/2024 M. Kelly Keia Rask, PT, DPT Physical Therapist- [place] Office Number: [phone]

## 2024-11-29 ENCOUNTER — Other Ambulatory Visit: Payer: Self-pay

## 2024-12-01 NOTE — Therapy (Incomplete)
 OUTPATIENT PHYSICAL THERAPY NEURO TREATMENT   Patient Name: Tyler Castro MRN: [id number] DOB:1956/09/30, 68 y.o., male Today's Date: 12/01/2024   PCP: Auston Opal, DO  REFERRING PROVIDER: Tat, Asberry RAMAN, DO  END OF SESSION:    Past Medical History:  Diagnosis Date   Arthritis    shoulder   BPH (benign prostatic hyperplasia)    History of hepatitis 12/17/2003   chemical sulfa   Seasonal allergies    Past Surgical History:  Procedure Laterality Date   KNEE SURGERY Right 2002   LEFT HEART CATHETERIZATION WITH CORONARY ANGIOGRAM N/A 08/19/2013   Procedure: LEFT HEART CATHETERIZATION WITH CORONARY ANGIOGRAM;  Surgeon: Victory LELON Claudene DOUGLAS, MD;  Location: [facility] CATH LAB;  Service: Cardiovascular;  Laterality: N/A;   MENISCUS REPAIR Left 2002   PROSTATECTOMY  2011   SHOULDER SURGERY Right 1980   Patient Active Problem List   Diagnosis Date Noted   Parkinson's disease (HCC) 03/27/2015   CAD (coronary artery disease), native coronary artery 01/24/2014   PVC (premature ventricular contraction) 01/24/2014   Hyperlipidemia 01/24/2014   DOE (dyspnea on exertion) 09/24/2013   Exertional angina 08/19/2013    Class: Chronic    ONSET DATE: 10+ years   REFERRING DIAG: G20.B2 (ICD-10-CM) - Parkinson'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  Rationale for Evaluation and Treatment: Rehabilitation  SUBJECTIVE:                                                                                                                                                                                             SUBJECTIVE STATEMENT: Was not doing well with the PD pump and have switched back to tablets for carbidopa .  Cramping R > L beginning in feet and ascending.     Pt accompanied by: self  PERTINENT HISTORY: R knee surgery, R shoulder surgery  PAIN:  Are you having pain? Yes: NPRS scale: 7/10 Pain location: B LEs Pain description:  cramping Aggravating factors: not known Relieving factors: moving  PRECAUTIONS: Fall; pt has continuous infusion of Levodopa  through abdomen   RED FLAGS: None   WEIGHT BEARING RESTRICTIONS: No  FALLS: Has patient fallen in last 6 months? No  LIVING ENVIRONMENT: Lives with: lives with their spouse Lives in: House/apartment Stairs: 3-6 steps to enter with handrail in the back; 2 story home  Has following equipment at home: walking stick  PLOF: Independent, Vocation/Vocational requirements: retired, and Leisure: reading, fixing things around the house  PATIENT GOALS: reducing pain and getting more control over my muscles to move more freely   OBJECTIVE:     TODAY'S TREATMENT:  12/03/24 Activity Comments                       TODAY'S TREATMENT: 11/26/24 Activity Comments  10 meter walk: 13.46 sec   Mini-BESTest 19/28  Pt education -Discussion regarding cue strategies for FoG -instruction and work sheet for assessing orthostatic hypotension             Note: Objective measures were completed at Evaluation unless otherwise noted.  DIAGNOSTIC FINDINGS: none recent  COGNITION: Overall cognitive status: Within functional limits for tasks assessed   SENSATION: Pt reports occasional N/T in feet  COORDINATION: Alternating pronation/supination: intact B Alternating toe tap: pt reports difficulty increasing speed Finger to nose: intact B    MUSCLE TONE: marked R LE rigidity   POSTURE: rounded shoulders  LOWER EXTREMITY ROM:     Active  Right Eval Left Eval  Hip flexion    Hip extension    Hip abduction    Hip adduction    Hip internal rotation    Hip external rotation    Knee flexion    Knee extension    Ankle dorsiflexion 12 18  Ankle plantarflexion    Ankle inversion    Ankle eversion     (Blank rows = not tested)  LOWER EXTREMITY MMT:    MMT Right Eval Left Eval  Hip flexion 4+ 4+  Hip extension    Hip abduction 4+ 4+  Hip adduction  4+ 4+  Hip internal rotation    Hip external rotation    Knee flexion 5 4  Knee extension 4+ 5  Ankle dorsiflexion 4+ *c/o cramping 4+ c/o cramping  Ankle plantarflexion 4-/5 *in standing  3/5  *in standing  Ankle inversion    Ankle eversion    (Blank rows = not tested)  GAIT: Findings: Assistive device utilized:None, Level of assistance: Modified independence, and Comments: good long stride with straight ahead walking but festinating/freezing evident with turns   FUNCTIONAL TESTS:  : 10.25 sec (3.2 ft/sec) *pt reported lightheadedness upon standing up from exam room which was addressed with water break                                                                                                                               TREATMENT DATE: 11/08/24    PATIENT EDUCATION: Education details: prognosis, POC, edu on benefits of OT for pt's concerns- he reports he would like to hold off for now Person educated: Patient Education method: Explanation, Demonstration, Tactile cues, Verbal cues, and Handouts Education comprehension: verbalized understanding and returned demonstration  HOME EXERCISE PROGRAM: Access Code: [email or web address] URL: [email or web address] Date: 11/08/2024 Prepared by: [facility] - Outpatient  Rehab - [facility]  Exercises - Gastroc Stretch with Foot at Wall  - 1 x daily - 5 x weekly - 2 sets - 30 sec  hold - Seated Hamstring Stretch  - 1 x daily - 5 x weekly - 2  sets - 30 sec hold - Single Leg Heel Raise with Counter Support  - 1 x daily - 5 x weekly - 2 sets - 10 reps - Long Sitting Plantar Fascia Stretch with Towel  - 3 sets - 30-60 sec hold  GOALS: Goals reviewed with patient? Yes  SHORT TERM GOALS: Target date: 11/29/2024  Patient to be independent with initial HEP. Baseline: HEP initiated Goal status: INITIAL    LONG TERM GOALS: Target date: 12/24/2024  Patient to be independent with advanced HEP. Baseline: Not yet initiated   Goal status: INITIAL  Patient to improve MiniBestTest score to atleast 17-21 to decrease risk of falls.  Baseline: NT Goal status: INITIAL  Patient to report compliance with stretching regimen to manage LE rigidity.  Baseline: not performing  Goal status: INITIAL  Patient to report 50% improvement in dizziness.    Baseline: - Goal status: INITIAL  Patient to report 50% improvement in LE pain/cramping.  Baseline: - Goal status: INITIAL  Patient to verbalize tips to reduce freezing/festination with gait and turns. Baseline: Not yet initiated  Goal status: INITIAL   ASSESSMENT:  CLINICAL IMPRESSION:  Pt reports he was having adverse effect to recent medication change (pump) and has reverted to use of tablet form and notes it is not likely having full effect as his motor symptoms of PD have been worse this AM.  Mini-BESTest performed w/ score 19/28 indicating increased risk for falls.  Notably, he was having significant trouble with turning/pivoting today requiring 7-8 steps to negotiation 180 degree turns and unable to tolerate static multisensory balance.  Provided education regarding FoG strategies and provided compreshensive list of different cue-augmented strategies.  Continued sessions to progress POC details as able   OBJECTIVE IMPAIRMENTS: Abnormal gait, decreased activity tolerance, decreased balance, decreased coordination, difficulty walking, decreased ROM, decreased strength, dizziness, increased muscle spasms, impaired flexibility, postural dysfunction, and pain.   ACTIVITY LIMITATIONS: carrying, lifting, bending, standing, squatting, stairs, transfers, bathing, dressing, hygiene/grooming, and locomotion level  PARTICIPATION LIMITATIONS: meal prep, cleaning, laundry, shopping, community activity, yard work, and church  PERSONAL FACTORS: Age, Fitness, Past/current experiences, Time since onset of injury/illness/exacerbation, and 1-2 comorbidities: R knee surgery, R  shoulder surgery,  are also affecting patient's functional outcome.   REHAB POTENTIAL: Good  CLINICAL DECISION MAKING: Evolving/moderate complexity  EVALUATION COMPLEXITY: Moderate  PLAN:  PT FREQUENCY: 1x/week  PT DURATION: 6 weeks  PLANNED INTERVENTIONS: 97164- PT Re-evaluation, 97110-Therapeutic exercises, 97530- Therapeutic activity, [id number]- Neuromuscular re-education, 97535- Self Care, 02859- Manual therapy, [id number]- Gait training, [phone]- Canalith repositioning, [id number]- Aquatic Therapy, [phone]- Electrical stimulation (manual), [phone] (1-2 muscles), 20561 (3+ muscles)- Dry Needling, Patient/Family education, Balance training, Stair training, Taping, Joint mobilization, Spinal mobilization, Vestibular training, Cryotherapy, and Moist heat  PLAN FOR NEXT SESSION: check orthostatics, HEP/balance activities   3:22 PM, 12/01/2024 M. Kelly Halpin, PT, DPT Physical Therapist- [place] Office Number: [phone]

## 2024-12-03 ENCOUNTER — Encounter: Payer: Self-pay | Admitting: Physical Therapy

## 2024-12-03 ENCOUNTER — Ambulatory Visit: Admitting: Physical Therapy

## 2024-12-03 DIAGNOSIS — R29818 Other symptoms and signs involving the nervous system: Secondary | ICD-10-CM

## 2024-12-03 DIAGNOSIS — R252 Cramp and spasm: Secondary | ICD-10-CM | POA: Diagnosis not present

## 2024-12-03 DIAGNOSIS — R2689 Other abnormalities of gait and mobility: Secondary | ICD-10-CM

## 2024-12-10 ENCOUNTER — Ambulatory Visit: Admitting: Physical Therapy

## 2024-12-10 ENCOUNTER — Encounter: Payer: Self-pay | Admitting: Physical Therapy

## 2024-12-10 DIAGNOSIS — R29818 Other symptoms and signs involving the nervous system: Secondary | ICD-10-CM

## 2024-12-10 DIAGNOSIS — R252 Cramp and spasm: Secondary | ICD-10-CM | POA: Diagnosis not present

## 2024-12-10 DIAGNOSIS — R2689 Other abnormalities of gait and mobility: Secondary | ICD-10-CM

## 2024-12-10 NOTE — Therapy (Signed)
 " OUTPATIENT PHYSICAL THERAPY NEURO TREATMENT   Patient Name: Tyler Castro MRN: [id number] DOB:1956/03/05, 68 y.o., male Today's Date: 12/10/2024   PCP: Auston Opal, DO  REFERRING PROVIDER: Tat, Asberry RAMAN, DO  END OF SESSION:  PT End of Session - 12/10/24 0931     Visit Number 4    Number of Visits 7    Date for Recertification  12/24/24    Authorization Type Medicare    PT Start Time 0932    PT Stop Time 1012    PT Time Calculation (min) 40 min    Equipment Utilized During Treatment Gait belt    Activity Tolerance Patient tolerated treatment well    Behavior During Therapy [facility] for tasks assessed/performed            Past Medical History:  Diagnosis Date   Arthritis    shoulder   BPH (benign prostatic hyperplasia)    History of hepatitis 12/17/2003   chemical sulfa   Seasonal allergies    Past Surgical History:  Procedure Laterality Date   KNEE SURGERY Right 2002   LEFT HEART CATHETERIZATION WITH CORONARY ANGIOGRAM N/A 08/19/2013   Procedure: LEFT HEART CATHETERIZATION WITH CORONARY ANGIOGRAM;  Surgeon: Victory LELON Claudene DOUGLAS, MD;  Location: [facility] CATH LAB;  Service: Cardiovascular;  Laterality: N/A;   MENISCUS REPAIR Left 2002   PROSTATECTOMY  2011   SHOULDER SURGERY Right 1980   Patient Active Problem List   Diagnosis Date Noted   Parkinson's disease (HCC) 03/27/2015   CAD (coronary artery disease), native coronary artery 01/24/2014   PVC (premature ventricular contraction) 01/24/2014   Hyperlipidemia 01/24/2014   DOE (dyspnea on exertion) 09/24/2013   Exertional angina 08/19/2013    Class: Chronic    ONSET DATE: 10+ years   REFERRING DIAG: G20.B2 (ICD-10-CM) - Parkinson's disease with dyskinesia and fluctuating manifestations (HCC)  THERAPY DIAG:  Other abnormalities of gait and mobility  Other symptoms and signs involving the nervous system  Rationale for Evaluation and Treatment: Rehabilitation  SUBJECTIVE:                                                                                                                                                                                              SUBJECTIVE STATEMENT: The pump is out.  It made the walking better and sleeping, but the overall pain and cramping was so bad.  The pump has been discontinued for about 2 week.  Back on the tablets.  Mainly have some stuttering steps when I first start going.  Been working on the strategies  Pt accompanied by: self  PERTINENT HISTORY: R knee surgery,  R shoulder surgery  PAIN:  Are you having pain? No  PRECAUTIONS: Fall; pt has continuous infusion of Levodopa  through abdomen   RED FLAGS: None   WEIGHT BEARING RESTRICTIONS: No  FALLS: Has patient fallen in last 6 months? No  LIVING ENVIRONMENT: Lives with: lives with their spouse Lives in: House/apartment Stairs: 3-6 steps to enter with handrail in the back; 2 story home  Has following equipment at home: walking stick  PLOF: Independent, Vocation/Vocational requirements: retired, and Leisure: reading, fixing things around the house  PATIENT GOALS: reducing pain and getting more control over my muscles to move more freely   OBJECTIVE:    TODAY'S TREATMENT: 12/10/2024 Activity Comments  Orthostatic BP measures: Supine after 5 min Standing after 1 min Standing after 3 miAn  169/92, HR 42 1336/81 HR 56 114/79 HR 58 bpm  Discussed orthostatic hypotension and ways to mitigate   Stagger stance rocking Wide BOS rocking  From sitting position, with starts/stops with gait  Gait with start/stops  Pt resonates with stagger stance rocking back/forth to initiate gait  At counter-stagger stance rocking forward/back Forward/back stepping Tends to be more ankle strategy heavy, cues for technique          HOME EXERCISE PROGRAM Access Code: [email or web address] URL: [email or web address] Date: 12/10/2024 Prepared by: [facility] - Outpatient  Rehab - [facility]  Exercises -  Gastroc Stretch with Foot at Wall  - 1 x daily - 5 x weekly - 2 sets - 30 sec  hold - Seated Hamstring Stretch  - 1 x daily - 5 x weekly - 2 sets - 30 sec hold - Single Leg Heel Raise with Counter Support  - 1 x daily - 5 x weekly - 2 sets - 10 reps - Long Sitting Plantar Fascia Stretch with Towel  - 3 sets - 30-60 sec hold - Standing Weight Shifting Forward and Backward  - 1 x daily - 5 x weekly - 2 sets - 10 reps - Standing Anterior Posterior Weight Shift  - 1 x daily - 5 x weekly - 2 sets - 10 reps - Stride Stance Weight Shift  - 1 x daily - 7 x weekly - 2 sets - 10 reps - Standing Weight Shift  - 1 x daily - 7 x weekly - 2 sets - 10 reps *Printed out instructions for other tips/strategies for freezing of gait   PATIENT EDUCATION: Education details:provided handout on additional FOG strategies, HEP update; orthostatic BP measures and how to address Person educated: Patient Education method: Explanation, Demonstration, Tactile cues, Verbal cues, and Handouts Education comprehension: verbalized understanding and returned demonstration     Note: Objective measures were completed at Evaluation unless otherwise noted.  DIAGNOSTIC FINDINGS: none recent  COGNITION: Overall cognitive status: Within functional limits for tasks assessed   SENSATION: Pt reports occasional N/T in feet  COORDINATION: Alternating pronation/supination: intact B Alternating toe tap: pt reports difficulty increasing speed Finger to nose: intact B    MUSCLE TONE: marked R LE rigidity   POSTURE: rounded shoulders  LOWER EXTREMITY ROM:     Active  Right Eval Left Eval  Hip flexion    Hip extension    Hip abduction    Hip adduction    Hip internal rotation    Hip external rotation    Knee flexion    Knee extension    Ankle dorsiflexion 12 18  Ankle plantarflexion    Ankle inversion    Ankle eversion     (Blank  rows = not tested)  LOWER EXTREMITY MMT:    MMT Right Eval Left Eval  Hip  flexion 4+ 4+  Hip extension    Hip abduction 4+ 4+  Hip adduction 4+ 4+  Hip internal rotation    Hip external rotation    Knee flexion 5 4  Knee extension 4+ 5  Ankle dorsiflexion 4+ *c/o cramping 4+ c/o cramping  Ankle plantarflexion 4-/5 *in standing  3/5  *in standing  Ankle inversion    Ankle eversion    (Blank rows = not tested)  GAIT: Findings: Assistive device utilized:None, Level of assistance: Modified independence, and Comments: good long stride with straight ahead walking but festinating/freezing evident with turns   FUNCTIONAL TESTS:  : 10.25 sec (3.2 ft/sec) *pt reported lightheadedness upon standing up from exam room which was addressed with water break                                                                                                                               TREATMENT DATE: 11/08/24    PATIENT EDUCATION: Education details: prognosis, POC, edu on benefits of OT for pt's concerns- he reports he would like to hold off for now Person educated: Patient Education method: Explanation, Demonstration, Tactile cues, Verbal cues, and Handouts Education comprehension: verbalized understanding and returned demonstration  HOME EXERCISE PROGRAM: Access Code: [email or web address] URL: [email or web address] Date: 11/08/2024 Prepared by: [facility] - Outpatient  Rehab - [facility]  Exercises - Gastroc Stretch with Foot at Wall  - 1 x daily - 5 x weekly - 2 sets - 30 sec  hold - Seated Hamstring Stretch  - 1 x daily - 5 x weekly - 2 sets - 30 sec hold - Single Leg Heel Raise with Counter Support  - 1 x daily - 5 x weekly - 2 sets - 10 reps - Long Sitting Plantar Fascia Stretch with Towel  - 3 sets - 30-60 sec hold  GOALS: Goals reviewed with patient? Yes  SHORT TERM GOALS: Target date: 11/29/2024  Patient to be independent with initial HEP. Baseline: HEP initiated Goal status: INITIAL    LONG TERM GOALS: Target date: 12/24/2024  Patient  to be independent with advanced HEP. Baseline: Not yet initiated  Goal status: INITIAL  Patient to improve MiniBestTest score to atleast 17-21 to decrease risk of falls.  Baseline: NT Goal status: INITIAL  Patient to report compliance with stretching regimen to manage LE rigidity.  Baseline: not performing  Goal status: INITIAL  Patient to report 50% improvement in dizziness.    Baseline: - Goal status: INITIAL  Patient to report 50% improvement in LE pain/cramping.  Baseline: - Goal status: INITIAL  Patient to verbalize tips to reduce freezing/festination with gait and turns. Baseline: Not yet initiated  Goal status: INITIAL   ASSESSMENT:  CLINICAL IMPRESSION: Pt presents today reporting that he has discontinued use of Vyalev pump and has returned to oral PD medications. Skilled PT  session focused on assessing BP orthostatic measures, with 50 point drop systolic and >10 point drop diastolic.  Discussed ways to manage and pt has mentioned BP measures to both neurologist and PCP.  Also worked on additional strategies for initiating gait from initial standing and with stop/starts with gait.  He does well with stagger stance position weightshifting to initiate large steps.  Pt will continue to benefit from skilled PT towards goals for improved functional mobility and decreased fall risk.    OBJECTIVE IMPAIRMENTS: Abnormal gait, decreased activity tolerance, decreased balance, decreased coordination, difficulty walking, decreased ROM, decreased strength, dizziness, increased muscle spasms, impaired flexibility, postural dysfunction, and pain.   ACTIVITY LIMITATIONS: carrying, lifting, bending, standing, squatting, stairs, transfers, bathing, dressing, hygiene/grooming, and locomotion level  PARTICIPATION LIMITATIONS: meal prep, cleaning, laundry, shopping, community activity, yard work, and church  PERSONAL FACTORS: Age, Fitness, Past/current experiences, Time since onset of  injury/illness/exacerbation, and 1-2 comorbidities: R knee surgery, R shoulder surgery,  are also affecting patient's functional outcome.   REHAB POTENTIAL: Good  CLINICAL DECISION MAKING: Evolving/moderate complexity  EVALUATION COMPLEXITY: Moderate  PLAN:  PT FREQUENCY: 1x/week  PT DURATION: 6 weeks  PLANNED INTERVENTIONS: 97164- PT Re-evaluation, 97110-Therapeutic exercises, 97530- Therapeutic activity, 97112- Neuromuscular re-education, 97535- Self Care, 02859- Manual therapy, [phone]- Gait training, [phone]- Canalith repositioning, [id number]- Aquatic Therapy, [phone]- Electrical stimulation (manual), [phone] (1-2 muscles), [phone] (3+ muscles)- Dry Needling, Patient/Family education, Balance training, Stair training, Taping, Joint mobilization, Spinal mobilization, Vestibular training, Cryotherapy, and Moist heat  PLAN FOR NEXT SESSION: Continue balance, initiating gait; multisensory challenges, FOG, HEP/balance activities   Greig Anon, PT 12/10/2024 9:32 AM Phone: [phone] Fax: [phone]  [facility] Re[facility] at [facility] [address], [address] [place], [place] [address] Phone # [phone] Fax # [phone]         "

## 2024-12-10 NOTE — Patient Instructions (Signed)
Tips to reduce freezing episodes with standing or walking:  Stand tall with your feet wide, so that you can rock and weight shift through your hips. Don't try to fight the freeze: if you begin taking slower, faster, smaller steps, STOP, get your posture tall, and RESET your posture and balance.  Take a deep breath before taking the BIG step to start again. March in place, with high knee stepping, to get started walking again. Use auditory cues:  Count out loud, think of a familiar tune or song or cadence, use pocket metronome, to use rhythm to get started walking again. Use visual cues:  Use a line to step over, use laser pointer line to step over, (using BIG steps) to start walking again. Use visual targets to keep your posture tall (look ahead and focus on an object or target at eye level). As you approach where your destination with walking, count your steps out loud and/or focus on your target with your eyes until you are fully there. Use appropriate assistive device, as advised by your physical therapist to assist with taking longer, consistent steps.  

## 2024-12-17 ENCOUNTER — Other Ambulatory Visit: Payer: Self-pay | Admitting: Neurology

## 2024-12-17 ENCOUNTER — Encounter: Payer: Self-pay | Admitting: Physical Therapy

## 2024-12-17 ENCOUNTER — Ambulatory Visit: Attending: Neurology | Admitting: Physical Therapy

## 2024-12-17 DIAGNOSIS — R29818 Other symptoms and signs involving the nervous system: Secondary | ICD-10-CM | POA: Insufficient documentation

## 2024-12-17 DIAGNOSIS — R2689 Other abnormalities of gait and mobility: Secondary | ICD-10-CM | POA: Insufficient documentation

## 2024-12-17 NOTE — Therapy (Signed)
 " OUTPATIENT PHYSICAL THERAPY NEURO TREATMENT   Patient Name: Tyler Castro MRN: [id number] DOB:11-Dec-1956, 69 y.o., male Today's Date: 12/17/2024   PCP: Auston Opal, DO  REFERRING PROVIDER: Tat, Asberry RAMAN, DO  END OF SESSION:  PT End of Session - 12/17/24 0932     Visit Number 5    Number of Visits 7    Date for Recertification  12/24/24    Authorization Type Medicare    PT Start Time 0934    PT Stop Time 1014    PT Time Calculation (min) 40 min    Equipment Utilized During Treatment Gait belt    Activity Tolerance Patient tolerated treatment well    Behavior During Therapy [facility] for tasks assessed/performed             Past Medical History:  Diagnosis Date   Arthritis    shoulder   BPH (benign prostatic hyperplasia)    History of hepatitis 12/17/2003   chemical sulfa   Seasonal allergies    Past Surgical History:  Procedure Laterality Date   KNEE SURGERY Right 2002   LEFT HEART CATHETERIZATION WITH CORONARY ANGIOGRAM N/A 08/19/2013   Procedure: LEFT HEART CATHETERIZATION WITH CORONARY ANGIOGRAM;  Surgeon: Victory LELON Claudene DOUGLAS, MD;  Location: [facility] CATH LAB;  Service: Cardiovascular;  Laterality: N/A;   MENISCUS REPAIR Left 2002   PROSTATECTOMY  2011   SHOULDER SURGERY Right 1980   Patient Active Problem List   Diagnosis Date Noted   Parkinson's disease (HCC) 03/27/2015   CAD (coronary artery disease), native coronary artery 01/24/2014   PVC (premature ventricular contraction) 01/24/2014   Hyperlipidemia 01/24/2014   DOE (dyspnea on exertion) 09/24/2013   Exertional angina 08/19/2013    Class: Chronic    ONSET DATE: 10+ years   REFERRING DIAG: G20.B2 (ICD-10-CM) - Parkinson's disease with dyskinesia and fluctuating manifestations (HCC)  THERAPY DIAG:  Other abnormalities of gait and mobility  Other symptoms and signs involving the nervous system  Rationale for Evaluation and Treatment: Rehabilitation  SUBJECTIVE:                                                                                                                                                                                              SUBJECTIVE STATEMENT: Moving a little slowly as it was a bad night for sleeping last night.  Things are better when I exercise and have the medication in my system.  The strategies are helping.  Pt accompanied by: self  PERTINENT HISTORY: R knee surgery, R shoulder surgery  PAIN:  Are you having pain? No  PRECAUTIONS: Fall   RED FLAGS: None  WEIGHT BEARING RESTRICTIONS: No  FALLS: Has patient fallen in last 6 months? No  LIVING ENVIRONMENT: Lives with: lives with their spouse Lives in: House/apartment Stairs: 3-6 steps to enter with handrail in the back; 2 story home  Has following equipment at home: walking stick  PLOF: Independent, Vocation/Vocational requirements: retired, and Leisure: reading, fixing things around the house  PATIENT GOALS: reducing pain and getting more control over my muscles to move more freely   OBJECTIVE:   Negotiating doorways slows me down Current exercise program:  uses stationary bike and weights at home; goes to [employer] and uses machines 2x/wk  TODAY'S TREATMENT: 12/17/2024 Activity Comments  Discussed and practiced strategies for negotiating doorways Use of visual targets/stop/starts  Sit to stand x 5 reps   Standing PWR! Moves  -PWR UP -PWR! Rock with dizziness looking up  -PWR! Step-side x 10 Forward/back x 10  X10 reps  BP Vitals sitting after dizziness 149/90 Reports cramping in his feet-like an off-time, so he takes PD medication  Gait with turns, through doorways, with added -carry tasks -cognitive tasks Cues for looking ahead to targets; slowed pace with dual task          HOME EXERCISE PROGRAM Access Code: [email or web address] URL: [email or web address] Date: 12/10/2024 Prepared by: [facility] - Outpatient  Rehab - [facility]  Exercises - Gastroc Stretch with  Foot at Wall  - 1 x daily - 5 x weekly - 2 sets - 30 sec  hold - Seated Hamstring Stretch  - 1 x daily - 5 x weekly - 2 sets - 30 sec hold - Single Leg Heel Raise with Counter Support  - 1 x daily - 5 x weekly - 2 sets - 10 reps - Long Sitting Plantar Fascia Stretch with Towel  - 3 sets - 30-60 sec hold - Standing Weight Shifting Forward and Backward  - 1 x daily - 5 x weekly - 2 sets - 10 reps - Standing Anterior Posterior Weight Shift  - 1 x daily - 5 x weekly - 2 sets - 10 reps - Stride Stance Weight Shift  - 1 x daily - 7 x weekly - 2 sets - 10 reps - Standing Weight Shift  - 1 x daily - 7 x weekly - 2 sets - 10 reps *Printed out instructions for other tips/strategies for freezing of gait   PATIENT EDUCATION: Education details:safety negotiating doorways to avoid FOG Person educated: Patient Education method: Explanation, Demonstration, Tactile cues, Verbal cues, and Handouts Education comprehension: verbalized understanding and returned demonstration     Note: Objective measures were completed at Evaluation unless otherwise noted.  DIAGNOSTIC FINDINGS: none recent  COGNITION: Overall cognitive status: Within functional limits for tasks assessed   SENSATION: Pt reports occasional N/T in feet  COORDINATION: Alternating pronation/supination: intact B Alternating toe tap: pt reports difficulty increasing speed Finger to nose: intact B    MUSCLE TONE: marked R LE rigidity   POSTURE: rounded shoulders  LOWER EXTREMITY ROM:     Active  Right Eval Left Eval  Hip flexion    Hip extension    Hip abduction    Hip adduction    Hip internal rotation    Hip external rotation    Knee flexion    Knee extension    Ankle dorsiflexion 12 18  Ankle plantarflexion    Ankle inversion    Ankle eversion     (Blank rows = not tested)  LOWER EXTREMITY MMT:    MMT  Right Eval Left Eval  Hip flexion 4+ 4+  Hip extension    Hip abduction 4+ 4+  Hip adduction 4+ 4+  Hip  internal rotation    Hip external rotation    Knee flexion 5 4  Knee extension 4+ 5  Ankle dorsiflexion 4+ *c/o cramping 4+ c/o cramping  Ankle plantarflexion 4-/5 *in standing  3/5  *in standing  Ankle inversion    Ankle eversion    (Blank rows = not tested)  GAIT: Findings: Assistive device utilized:None, Level of assistance: Modified independence, and Comments: good long stride with straight ahead walking but festinating/freezing evident with turns   FUNCTIONAL TESTS:  : 10.25 sec (3.2 ft/sec) *pt reported lightheadedness upon standing up from exam room which was addressed with water break                                                                                                                               TREATMENT DATE: 11/08/24    PATIENT EDUCATION: Education details: prognosis, POC, edu on benefits of OT for pt's concerns- he reports he would like to hold off for now Person educated: Patient Education method: Explanation, Demonstration, Tactile cues, Verbal cues, and Handouts Education comprehension: verbalized understanding and returned demonstration  HOME EXERCISE PROGRAM: Access Code: [email or web address] URL: [email or web address] Date: 11/08/2024 Prepared by: [facility] - Outpatient  Rehab - [facility]  Exercises - Gastroc Stretch with Foot at Wall  - 1 x daily - 5 x weekly - 2 sets - 30 sec  hold - Seated Hamstring Stretch  - 1 x daily - 5 x weekly - 2 sets - 30 sec hold - Single Leg Heel Raise with Counter Support  - 1 x daily - 5 x weekly - 2 sets - 10 reps - Long Sitting Plantar Fascia Stretch with Towel  - 3 sets - 30-60 sec hold  GOALS: Goals reviewed with patient? Yes  SHORT TERM GOALS: Target date: 11/29/2024  Patient to be independent with initial HEP. Baseline: HEP initiated Goal status: INITIAL    LONG TERM GOALS: Target date: 12/24/2024  Patient to be independent with advanced HEP. Baseline: Not yet initiated  Goal status:  INITIAL  Patient to improve MiniBestTest score to atleast 17-21 to decrease risk of falls.  Baseline: NT Goal status: INITIAL  Patient to report compliance with stretching regimen to manage LE rigidity.  Baseline: not performing  Goal status: INITIAL  Patient to report 50% improvement in dizziness.    Baseline: - Goal status: INITIAL  Patient to report 50% improvement in LE pain/cramping.  Baseline: - Goal status: INITIAL  Patient to verbalize tips to reduce freezing/festination with gait and turns. Baseline: Not yet initiated  Goal status: INITIAL   ASSESSMENT:  CLINICAL IMPRESSION: Pt presents today with reports of slow movements today (nearing off time and didn't sleep well last night). He does note difficulty with freezing of gait sometimes at doorways.  Skilled PT session  focused on dynamic warm up with standing PWR! Moves (pt has some dizziness with head motions and wide BOS reaching tasks.  Checked BP and it is not low; wonder if this is a factor of larger movement patterns + head movements bringing on some movement sensitivity.  This resolves after several minutes, so proceeded to work on gait with doorway negotiation, including with dual tasking. Pt needs cues for posture and use of visual cues through doorway. Pt will continue to benefit from skilled PT towards goals for improved functional mobility and decreased fall risk.   OBJECTIVE IMPAIRMENTS: Abnormal gait, decreased activity tolerance, decreased balance, decreased coordination, difficulty walking, decreased ROM, decreased strength, dizziness, increased muscle spasms, impaired flexibility, postural dysfunction, and pain.   ACTIVITY LIMITATIONS: carrying, lifting, bending, standing, squatting, stairs, transfers, bathing, dressing, hygiene/grooming, and locomotion level  PARTICIPATION LIMITATIONS: meal prep, cleaning, laundry, shopping, community activity, yard work, and church  PERSONAL FACTORS: Age, Fitness,  Past/current experiences, Time since onset of injury/illness/exacerbation, and 1-2 comorbidities: R knee surgery, R shoulder surgery,  are also affecting patient's functional outcome.   REHAB POTENTIAL: Good  CLINICAL DECISION MAKING: Evolving/moderate complexity  EVALUATION COMPLEXITY: Moderate  PLAN:  PT FREQUENCY: 1x/week  PT DURATION: 6 weeks  PLANNED INTERVENTIONS: 97164- PT Re-evaluation, 97110-Therapeutic exercises, 97530- Therapeutic activity, [id number]- Neuromuscular re-education, 97535- Self Care, 02859- Manual therapy, [id number]- Gait training, [phone]- Canalith repositioning, [id number]- Aquatic Therapy, [phone]- Electrical stimulation (manual), [phone] (1-2 muscles), [phone] (3+ muscles)- Dry Needling, Patient/Family education, Balance training, Stair training, Taping, Joint mobilization, Spinal mobilization, Vestibular training, Cryotherapy, and Moist heat  PLAN FOR NEXT SESSION: Check LTGs and discuss POC.     Greig Anon, PT 12/17/2024 10:19 AM Phone: [phone] Fax: [phone]  [facility] Outpatient Re[facility] at [facility] [address] [place], [address] [place], [place] [address] Phone # [phone] Fax # [phone]         "

## 2024-12-24 ENCOUNTER — Ambulatory Visit: Admitting: Physical Therapy

## 2024-12-24 ENCOUNTER — Encounter: Payer: Self-pay | Admitting: Physical Therapy

## 2024-12-24 DIAGNOSIS — R2689 Other abnormalities of gait and mobility: Secondary | ICD-10-CM

## 2024-12-24 DIAGNOSIS — R29818 Other symptoms and signs involving the nervous system: Secondary | ICD-10-CM

## 2024-12-24 NOTE — Therapy (Signed)
 " OUTPATIENT PHYSICAL THERAPY NEURO TREATMENT/DISCHARGE SUMMARY   Patient Name: Tyler Castro MRN: [id number] DOB:06-02-1956, 69 y.o., male Today's Date: 12/24/2024   PCP: Auston Opal, DO  REFERRING PROVIDER: Tat, Asberry RAMAN, DO  PHYSICAL THERAPY DISCHARGE SUMMARY  Visits from Start of Care: 6  Current functional level related to goals / functional outcomes: Pt has met 4 of 6 LTGs-see below   Remaining deficits: Bradykinesia/rigidity, cramping, off times of medication   Education / Equipment: HEP   Patient agrees to discharge. Patient goals were partially met. Patient is being discharged due to being pleased with the current functional level.  END OF SESSION:  PT End of Session - 12/24/24 0934     Visit Number 6    Number of Visits 7    Date for Recertification  12/24/24    Authorization Type Medicare    PT Start Time 0935    PT Stop Time 1015    PT Time Calculation (min) 40 min    Equipment Utilized During Treatment Gait belt    Activity Tolerance Patient tolerated treatment well    Behavior During Therapy WFL for tasks assessed/performed              Past Medical History:  Diagnosis Date   Arthritis    shoulder   BPH (benign prostatic hyperplasia)    History of hepatitis 12/17/2003   chemical sulfa   Seasonal allergies    Past Surgical History:  Procedure Laterality Date   KNEE SURGERY Right 2002   LEFT HEART CATHETERIZATION WITH CORONARY ANGIOGRAM N/A 08/19/2013   Procedure: LEFT HEART CATHETERIZATION WITH CORONARY ANGIOGRAM;  Surgeon: Victory LELON Claudene DOUGLAS, MD;  Location: [facility] CATH LAB;  Service: Cardiovascular;  Laterality: N/A;   MENISCUS REPAIR Left 2002   PROSTATECTOMY  2011   SHOULDER SURGERY Right 1980   Patient Active Problem List   Diagnosis Date Noted   Parkinson's disease (HCC) 03/27/2015   CAD (coronary artery disease), native coronary artery 01/24/2014   PVC (premature ventricular contraction) 01/24/2014   Hyperlipidemia 01/24/2014   DOE  (dyspnea on exertion) 09/24/2013   Exertional angina 08/19/2013    Class: Chronic    ONSET DATE: 10+ years   REFERRING DIAG: G20.B2 (ICD-10-CM) - Parkinson's disease with dyskinesia and fluctuating manifestations (HCC)  THERAPY DIAG:  Other abnormalities of gait and mobility  Other symptoms and signs involving the nervous system  Rationale for Evaluation and Treatment: Rehabilitation  SUBJECTIVE:  SUBJECTIVE STATEMENT: Therapy has been good, as I have really used the strategies.  Really cut the stuttering steps and freezing.  Right now, I'm cramping due to the off time of medication.  Pt accompanied by: self  PERTINENT HISTORY: R knee surgery, R shoulder surgery  PAIN:  Are you having pain? No  PRECAUTIONS: Fall   RED FLAGS: None   WEIGHT BEARING RESTRICTIONS: No  FALLS: Has patient fallen in last 6 months? No  LIVING ENVIRONMENT: Lives with: lives with their spouse Lives in: House/apartment Stairs: 3-6 steps to enter with handrail in the back; 2 story home  Has following equipment at home: walking stick  PLOF: Independent, Vocation/Vocational requirements: retired, and Leisure: reading, fixing things around the house  PATIENT GOALS: reducing pain and getting more control over my muscles to move more freely   OBJECTIVE:    TODAY'S TREATMENT: 12/24/2024 Activity Comments  MiniBESTest 23/28 Improved from 19/28  FTSTS:  19.94 sec   10 m walk:  9.62 sec = 3.41 f/tsec   TUG 14.85 sec TUG cognitive 18.94 sec   HEP review Pt needs cues throughout  Supine lumbar flexibility to address rigidity in the mornings: -SKTC x 3 -DKTC x 2 reps -lumbar rotation-gentle rocking -progressed to sitting EOM:  forward flexion to upright posture x 5 reps Pt notes relief feels good   Access  Code: [email or web address] URL: [email or web address] Date: 12/24/2024 Prepared by: [facility] - Outpatient  Rehab - [facility]  Exercises - Gastroc Stretch with Foot at Wall  - 1 x daily - 5 x weekly - 2 sets - 30 sec  hold - Seated Hamstring Stretch  - 1 x daily - 5 x weekly - 2 sets - 30 sec hold - Single Leg Heel Raise with Counter Support  - 1 x daily - 5 x weekly - 2 sets - 10 reps - Long Sitting Plantar Fascia Stretch with Towel  - 3 sets - 30-60 sec hold - Standing Weight Shifting Forward and Backward  - 1 x daily - 5 x weekly - 2 sets - 10 reps - Standing Anterior Posterior Weight Shift  - 1 x daily - 5 x weekly - 2 sets - 10 reps - Stride Stance Weight Shift  - 1 x daily - 7 x weekly - 2 sets - 10 reps - Standing Weight Shift  - 1 x daily - 7 x weekly - 2 sets - 10 reps - Hooklying Single Knee to Chest Stretch  - 1 x daily - 7 x weekly - 1 sets - 5 reps - 5-10 sec hold - Supine Double Knee to Chest  - 1 x daily - 7 x weekly - 1 sets - 5 reps - 5-10 sec hold - Seated Active Hip Flexion  - 1 x daily - 7 x weekly - 1 sets - 5 reps - 5-10 sec hold    PATIENT EDUCATION: Education details:Updates to HEP; discussed Parkinson [employer] recommendations on exercise, including recommendation for stretching daily; discussed POC and progress towards goals. Person educated: Patient Education method: Explanation, Demonstration, Tactile cues, Verbal cues, and Handouts Education comprehension: verbalized understanding and returned demonstration     Note: Objective measures were completed at Evaluation unless otherwise noted.  DIAGNOSTIC FINDINGS: none recent  COGNITION: Overall cognitive status: Within functional limits for tasks assessed   SENSATION: Pt reports occasional N/T in feet  COORDINATION: Alternating pronation/supination: intact B Alternating toe tap: pt reports difficulty increasing speed Finger to nose: intact B  MUSCLE TONE: marked R LE rigidity    POSTURE: rounded shoulders  LOWER EXTREMITY ROM:     Active  Right Eval Left Eval  Hip flexion    Hip extension    Hip abduction    Hip adduction    Hip internal rotation    Hip external rotation    Knee flexion    Knee extension    Ankle dorsiflexion 12 18  Ankle plantarflexion    Ankle inversion    Ankle eversion     (Blank rows = not tested)  LOWER EXTREMITY MMT:    MMT Right Eval Left Eval  Hip flexion 4+ 4+  Hip extension    Hip abduction 4+ 4+  Hip adduction 4+ 4+  Hip internal rotation    Hip external rotation    Knee flexion 5 4  Knee extension 4+ 5  Ankle dorsiflexion 4+ *c/o cramping 4+ c/o cramping  Ankle plantarflexion 4-/5 *in standing  3/5  *in standing  Ankle inversion    Ankle eversion    (Blank rows = not tested)  GAIT: Findings: Assistive device utilized:None, Level of assistance: Modified independence, and Comments: good long stride with straight ahead walking but festinating/freezing evident with turns   FUNCTIONAL TESTS:  : 10.25 sec (3.2 ft/sec) *pt reported lightheadedness upon standing up from exam room which was addressed with water break                                                                                                                               TREATMENT DATE: 11/08/24    PATIENT EDUCATION: Education details: prognosis, POC, edu on benefits of OT for pt's concerns- he reports he would like to hold off for now Person educated: Patient Education method: Explanation, Demonstration, Tactile cues, Verbal cues, and Handouts Education comprehension: verbalized understanding and returned demonstration  HOME EXERCISE PROGRAM: Access Code: [email or web address] URL: [email or web address] Date: 11/08/2024 Prepared by: [facility] - Outpatient  Rehab - [facility]  Exercises - Gastroc Stretch with Foot at Wall  - 1 x daily - 5 x weekly - 2 sets - 30 sec  hold - Seated Hamstring Stretch  - 1 x daily - 5 x  weekly - 2 sets - 30 sec hold - Single Leg Heel Raise with Counter Support  - 1 x daily - 5 x weekly - 2 sets - 10 reps - Long Sitting Plantar Fascia Stretch with Towel  - 3 sets - 30-60 sec hold  GOALS: Goals reviewed with patient? Yes  SHORT TERM GOALS: Target date: 11/29/2024  Patient to be independent with initial HEP. Baseline: HEP initiated Goal status: INITIAL    LONG TERM GOALS: Target date: 12/24/2024  Patient to be independent with advanced HEP. Baseline: Not performing consistently, needs reminder cues Goal status: NOT MET1/08/2025  Patient to improve MiniBestTest score to atleast 17-21 to decrease risk of falls.  Baseline: 23/28 Goal status: MET 12/24/2024  Patient to report  compliance with stretching regimen to manage LE rigidity.  Baseline: not performing consistently Goal status: NOT MET  Patient to report 50% improvement in dizziness.    Baseline: noted at least 50% improvement Goal status: MET1/08/2025  Patient to report 50% improvement in LE pain/cramping.  Baseline: still having the cramping with off times. Goal status: MET 12/24/2024  Patient to verbalize tips to reduce freezing/festination with gait and turns. Baseline: utilizing strategies  Goal status: MET1/08/2025   ASSESSMENT:  CLINICAL IMPRESSION: Pt presents today with reports that the strategies he has been educated on in therapy sessions have been very helpful to address decreased festination and freezing of gait.  He notes he is still struggling with off times of medications, resulting in cramping, stiffness. Skilled PT session focused on review of HEP and progression to include lumbar flexibility exercises, which pt finds helpful today.  He has met 4 of 6 LTGs, including improving MiniBESTest score to 23/28, from 19/28.  He feels he has what he needs to continue exercises and overall fitness routine at home; he is agreeable to follow up screens in 6 months due to progressive nature of disease.  He is  appropriate for discharge from PT at this time.     OBJECTIVE IMPAIRMENTS: Abnormal gait, decreased activity tolerance, decreased balance, decreased coordination, difficulty walking, decreased ROM, decreased strength, dizziness, increased muscle spasms, impaired flexibility, postural dysfunction, and pain.   ACTIVITY LIMITATIONS: carrying, lifting, bending, standing, squatting, stairs, transfers, bathing, dressing, hygiene/grooming, and locomotion level  PARTICIPATION LIMITATIONS: meal prep, cleaning, laundry, shopping, community activity, yard work, and church  PERSONAL FACTORS: Age, Fitness, Past/current experiences, Time since onset of injury/illness/exacerbation, and 1-2 comorbidities: R knee surgery, R shoulder surgery,  are also affecting patient's functional outcome.   REHAB POTENTIAL: Good  CLINICAL DECISION MAKING: Evolving/moderate complexity  EVALUATION COMPLEXITY: Moderate  PLAN:  PT FREQUENCY: 1x/week  PT DURATION: 6 weeks  PLANNED INTERVENTIONS: 97164- PT Re-evaluation, 97110-Therapeutic exercises, 97530- Therapeutic activity, [id number]- Neuromuscular re-education, 97535- Self Care, [id number]- Manual therapy, [id number]- Gait training, [phone]- Canalith repositioning, [id number]- Aquatic Therapy, [phone]- Electrical stimulation (manual), [phone] (1-2 muscles), [id number] (3+ muscles)- Dry Needling, Patient/Family education, Balance training, Stair training, Taping, Joint mobilization, Spinal mobilization, Vestibular training, Cryotherapy, and Moist heat  PLAN FOR NEXT SESSION: Discharge PT today; recommend PD screens in 6 months.     Greig Anon, PT 12/24/2024 10:22 AM Phone: [phone] Fax: [phone]  [facility] at [facility] [address] [place], [address] [place], [place] [address] Phone # [phone] Fax # [phone]         "

## 2025-01-17 ENCOUNTER — Other Ambulatory Visit (HOSPITAL_COMMUNITY): Payer: Self-pay

## 2025-03-07 ENCOUNTER — Ambulatory Visit: Admitting: Neurology

## 2025-08-25 ENCOUNTER — Ambulatory Visit: Admitting: Physical Therapy

## 2025-08-25 ENCOUNTER — Ambulatory Visit

## 2025-08-25 ENCOUNTER — Ambulatory Visit: Attending: Neurology | Admitting: Occupational Therapy
